# Patient Record
Sex: Male | Born: 1965 | Race: White | Hispanic: No | Smoking: Current every day smoker
Health system: Southern US, Community
[De-identification: ages and names within clinical notes are randomized; demographics above are authoritative.]

## PROBLEM LIST (undated history)

## (undated) ENCOUNTER — Emergency Department (HOSPITAL_COMMUNITY): Payer: Medicare Other

## (undated) DIAGNOSIS — B1920 Unspecified viral hepatitis C without hepatic coma: Secondary | ICD-10-CM

## (undated) DIAGNOSIS — F1920 Other psychoactive substance dependence, uncomplicated: Secondary | ICD-10-CM

## (undated) HISTORY — PX: BACK SURGERY: SHX140

## (undated) HISTORY — PX: APPENDECTOMY: SHX54

---

## 2018-12-07 ENCOUNTER — Other Ambulatory Visit: Payer: Self-pay

## 2018-12-07 ENCOUNTER — Emergency Department (HOSPITAL_COMMUNITY)
Admission: EM | Admit: 2018-12-07 | Discharge: 2018-12-07 | Disposition: A | Discharge status: Home / Self Care | Payer: Self-pay | Attending: Emergency Medicine | Admitting: Emergency Medicine

## 2018-12-07 ENCOUNTER — Emergency Department (HOSPITAL_COMMUNITY): Payer: Self-pay

## 2018-12-07 DIAGNOSIS — R109 Unspecified abdominal pain: Secondary | ICD-10-CM | POA: Insufficient documentation

## 2018-12-07 DIAGNOSIS — R0602 Shortness of breath: Secondary | ICD-10-CM | POA: Insufficient documentation

## 2018-12-07 DIAGNOSIS — R61 Generalized hyperhidrosis: Secondary | ICD-10-CM | POA: Insufficient documentation

## 2018-12-07 DIAGNOSIS — R079 Chest pain, unspecified: Secondary | ICD-10-CM | POA: Insufficient documentation

## 2018-12-07 DIAGNOSIS — R5383 Other fatigue: Secondary | ICD-10-CM | POA: Insufficient documentation

## 2018-12-07 LAB — BASIC METABOLIC PANEL
Anion gap: 9 (ref 5–15)
BUN: 16 mg/dL (ref 6–20)
CO2: 27 mmol/L (ref 22–32)
Calcium: 9.7 mg/dL (ref 8.9–10.3)
Chloride: 104 mmol/L (ref 98–111)
Creatinine, Ser: 0.84 mg/dL (ref 0.61–1.24)
GFR calc Af Amer: >60 mL/min (ref >60)
GFR calc non Af Amer: >60 mL/min (ref >60)
Glucose, Bld: 114 mg/dL — ABNORMAL HIGH (ref 70–99)
Potassium: 4.2 mmol/L (ref 3.5–5.1)
Sodium: 140 mmol/L (ref 135–145)

## 2018-12-07 LAB — CBC WITH DIFFERENTIAL/PLATELET
Abs Immature Granulocytes: 0.02 10*3/uL (ref 0.00–0.07)
Basophils Absolute: 0 10*3/uL (ref 0.0–0.1)
Basophils Relative: 1 %
Eosinophils Absolute: 0.2 10*3/uL (ref 0.0–0.5)
Eosinophils Relative: 3 %
HCT: 50.2 % (ref 39.0–52.0)
Hemoglobin: 16.6 g/dL (ref 13.0–17.0)
Immature Granulocytes: 0 %
Lymphocytes Relative: 30 %
Lymphs Abs: 1.6 10*3/uL (ref 0.7–4.0)
MCH: 30.3 pg (ref 26.0–34.0)
MCHC: 33.1 g/dL (ref 30.0–36.0)
MCV: 91.8 fL (ref 80.0–100.0)
Monocytes Absolute: 0.4 10*3/uL (ref 0.1–1.0)
Monocytes Relative: 7 %
Neutro Abs: 3.3 10*3/uL (ref 1.7–7.7)
Neutrophils Relative %: 59 %
Platelets: 85 10*3/uL — ABNORMAL LOW (ref 150–400)
RBC: 5.47 MIL/uL (ref 4.22–5.81)
RDW: 14.2 % (ref 11.5–15.5)
WBC: 5.5 10*3/uL (ref 4.0–10.5)
nRBC: 0 % (ref 0.0–0.2)

## 2018-12-07 LAB — TROPONIN I: Troponin I: <0.03 ng/mL (ref <0.03)

## 2018-12-07 MED ORDER — OMEPRAZOLE 20 MG PO CPDR: 20.0000 mg | DELAYED_RELEASE_CAPSULE | Freq: Two times a day (BID) | ORAL | 0 refills | Status: DC

## 2018-12-07 NOTE — ED Triage Notes (Signed)
Pt to ED from home. Pt wife brought to ED d/t pt having SOB over the past 7 days. Pt also c/o intermittent chest pain, back pain, and sweating  (temperature changes). Pt reports SOB worse with exertion. Pt reports family hx of cardiac problems. Pt denies medical hx.

## 2018-12-07 NOTE — ED Provider Notes (Signed)
[facility] DEPT Provider Note   CSN: [id number] Arrival date & time: 12/07/18  1028    History   Chief Complaint No chief complaint on file.   HPI Alex Jacobs is a 53 y.o. male.     HPI   89yM with exertional dyspnea. Onset about two weeks ago. Says he will feel sob and very fatigued even when walking to mailbox. Improves with rest. Also vague chest discomfort and into his back with this. He has had intermittent episodes of diaphoresis but they seem to be independent of activity. Also burning sensation in his stomach. No fever. No cough. No unusual leg pain or swelling. No personal hx of CAD but says multiple family members began having problems in their 16s. No local medical care as he just relocated from [facility] a few weeks ago.    No past medical history on file.  There are no active problems to display for this patient.      Home Medications    Prior to Admission medications   Not on File    Family History No family history on file.  Social History Social History   Tobacco Use   Smoking status: Not on file  Substance Use Topics   Alcohol use: Not on file   Drug use: Not on file     Allergies   Patient has no allergy information on record.   Review of Systems Review of Systems  All systems reviewed and negative, other than as noted in HPI.  Physical Exam Updated Vital Signs BP (!) 137/91 (BP Location: Left Arm)    Pulse 68    Temp 98.4 F (36.9 C) (Oral)    Resp 14    Ht 5\' 9"  (1.753 m)    Wt 77.1 kg    SpO2 99%    BMI 25.10 kg/m   Physical Exam Vitals signs and nursing note reviewed.  Constitutional:      General: He is not in acute distress.    Appearance: He is well-developed.  HENT:     Head: Normocephalic and atraumatic.  Eyes:     General:        Right eye: No discharge.        Left eye: No discharge.     Conjunctiva/sclera: Conjunctivae normal.  Neck:     Musculoskeletal: Neck supple.    Cardiovascular:     Rate and Rhythm: Normal rate and regular rhythm.     Heart sounds: Normal heart sounds. No murmur. No friction rub. No gallop.   Pulmonary:     Effort: Pulmonary effort is normal. No respiratory distress.     Breath sounds: Normal breath sounds.  Abdominal:     General: There is no distension.     Palpations: Abdomen is soft.     Tenderness: There is no abdominal tenderness.  Musculoskeletal:        General: No tenderness.     Comments: Lower extremities symmetric as compared to each other. No calf tenderness. Negative Homan's. No palpable cords.   Skin:    General: Skin is warm and dry.  Neurological:     Mental Status: He is alert.  Psychiatric:        Behavior: Behavior normal.        Thought Content: Thought content normal.      ED Treatments / Results  Labs (all labs ordered are listed, but only abnormal results are displayed) Labs Reviewed  CBC WITH DIFFERENTIAL/PLATELET - Abnormal;  Notable for the following components:      Result Value   Platelets 85 (*)    All other components within normal limits  BASIC METABOLIC PANEL - Abnormal; Notable for the following components:   Glucose, Bld 114 (*)    All other components within normal limits  TROPONIN I    EKG EKG Interpretation  Date/Time:  Tuesday December 07 2018 10:41:15 EDT Ventricular Rate:  62 PR Interval:    QRS Duration: 100 QT Interval:  456 QTC Calculation: 464 R Axis:   83 Text Interpretation:  Sinus rhythm Low voltage, precordial leads Baseline wander in lead(s) II III aVR aVF V4 V5 V6 No old tracing to compare Confirmed by Virgel Manifold [phone]) on 12/07/2018 10:44:04 AM   Radiology Dg Chest 2 View  Result Date: 12/07/2018 CLINICAL DATA:  Fever and shortness of breath EXAM: CHEST - 2 VIEW COMPARISON:  None. FINDINGS: Lungs are clear. Heart size and pulmonary vascularity are normal. No adenopathy. Postoperative change noted in the lower cervical spine. IMPRESSION: No edema or  consolidation. Electronically Signed   By: Lowella Grip III M.D.   On: 12/07/2018 11:24    Procedures Procedures (including critical care time)  Medications Ordered in ED Medications - No data to display   Initial Impression / Assessment and Plan / ED Course  I have reviewed the triage vital signs and the nursing notes.  Pertinent labs & imaging results that were available during my care of the patient were reviewed by me and considered in my medical decision making (see chart for details).     52yM with dyspnea. Some concern for anginal equivalent. ED w/u fairly unremarkable aside from thrombocytopenia which is likely contributory. I think ultimately he needs stress testing. He would like to pursue this as an outpt although I explained to him not having a PCP currently would be a significant barrier to this. Also may be some anxiety component in light of COVID pandemic. Clinically I doubt. Tried to reassure.   Alex Jacobs was evaluated in Emergency Department on 12/07/2018 for the symptoms described in the history of present illness. He was evaluated in the context of the global COVID-19 pandemic, which necessitated consideration that the patient might be at risk for infection with the SARS-CoV-2 virus that causes COVID-19. Institutional protocols and algorithms that pertain to the evaluation of patients at risk for COVID-19 are in a state of rapid change based on information released by regulatory bodies including the CDC and federal and state organizations. These policies and algorithms were followed during the patient's care in the ED.   Final Clinical Impressions(s) / ED Diagnoses   Final diagnoses:  Shortness of breath    ED Discharge Orders    None       Virgel Manifold, MD 12/08/18 [phone]

## 2018-12-07 NOTE — Discharge Instructions (Addendum)
I want you to follow-up and obtain a stress test. Return to the ER for worsening symptoms.

## 2018-12-07 NOTE — ED Notes (Signed)
EDP at bedside  

## 2019-04-07 ENCOUNTER — Emergency Department (HOSPITAL_COMMUNITY): Payer: Self-pay

## 2019-04-07 ENCOUNTER — Emergency Department (HOSPITAL_COMMUNITY)
Admission: EM | Admit: 2019-04-07 | Discharge: 2019-04-08 | Disposition: A | Discharge status: Home / Self Care | Payer: Self-pay | Attending: Emergency Medicine | Admitting: Emergency Medicine

## 2019-04-07 ENCOUNTER — Other Ambulatory Visit: Payer: Self-pay

## 2019-04-07 ENCOUNTER — Encounter (HOSPITAL_COMMUNITY): Payer: Self-pay

## 2019-04-07 DIAGNOSIS — R945 Abnormal results of liver function studies: Secondary | ICD-10-CM | POA: Insufficient documentation

## 2019-04-07 DIAGNOSIS — R7989 Other specified abnormal findings of blood chemistry: Secondary | ICD-10-CM

## 2019-04-07 DIAGNOSIS — F17210 Nicotine dependence, cigarettes, uncomplicated: Secondary | ICD-10-CM | POA: Insufficient documentation

## 2019-04-07 DIAGNOSIS — R112 Nausea with vomiting, unspecified: Secondary | ICD-10-CM | POA: Insufficient documentation

## 2019-04-07 DIAGNOSIS — R1084 Generalized abdominal pain: Secondary | ICD-10-CM | POA: Insufficient documentation

## 2019-04-07 DIAGNOSIS — R109 Unspecified abdominal pain: Secondary | ICD-10-CM

## 2019-04-07 DIAGNOSIS — R0789 Other chest pain: Secondary | ICD-10-CM | POA: Insufficient documentation

## 2019-04-07 DIAGNOSIS — Z79899 Other long term (current) drug therapy: Secondary | ICD-10-CM | POA: Insufficient documentation

## 2019-04-07 DIAGNOSIS — R630 Anorexia: Secondary | ICD-10-CM | POA: Insufficient documentation

## 2019-04-07 HISTORY — DX: Other psychoactive substance dependence, uncomplicated: F19.20

## 2019-04-07 LAB — CBC WITH DIFFERENTIAL/PLATELET
Abs Immature Granulocytes: 0.05 10*3/uL (ref 0.00–0.07)
Basophils Absolute: 0.1 10*3/uL (ref 0.0–0.1)
Basophils Relative: 1 %
Eosinophils Absolute: 0.2 10*3/uL (ref 0.0–0.5)
Eosinophils Relative: 2 %
HCT: 46.8 % (ref 39.0–52.0)
Hemoglobin: 16.1 g/dL (ref 13.0–17.0)
Immature Granulocytes: 1 %
Lymphocytes Relative: 22 %
Lymphs Abs: 2.2 10*3/uL (ref 0.7–4.0)
MCH: 32.9 pg (ref 26.0–34.0)
MCHC: 34.4 g/dL (ref 30.0–36.0)
MCV: 95.5 fL (ref 80.0–100.0)
Monocytes Absolute: 0.9 10*3/uL (ref 0.1–1.0)
Monocytes Relative: 9 %
Neutro Abs: 6.7 10*3/uL (ref 1.7–7.7)
Neutrophils Relative %: 65 %
Platelets: 112 10*3/uL — ABNORMAL LOW (ref 150–400)
RBC: 4.9 MIL/uL (ref 4.22–5.81)
RDW: 13.5 % (ref 11.5–15.5)
WBC: 10 10*3/uL (ref 4.0–10.5)
nRBC: 0 % (ref 0.0–0.2)

## 2019-04-07 LAB — COMPREHENSIVE METABOLIC PANEL
ALT: 65 U/L — ABNORMAL HIGH (ref 0–44)
AST: 84 U/L — ABNORMAL HIGH (ref 15–41)
Albumin: 4.6 g/dL (ref 3.5–5.0)
Alkaline Phosphatase: 74 U/L (ref 38–126)
Anion gap: 14 (ref 5–15)
BUN: 24 mg/dL — ABNORMAL HIGH (ref 6–20)
CO2: 21 mmol/L — ABNORMAL LOW (ref 22–32)
Calcium: 9.7 mg/dL (ref 8.9–10.3)
Chloride: 101 mmol/L (ref 98–111)
Creatinine, Ser: 1.47 mg/dL — ABNORMAL HIGH (ref 0.61–1.24)
GFR calc Af Amer: >60 mL/min (ref >60)
GFR calc non Af Amer: 54 mL/min — ABNORMAL LOW (ref >60)
Glucose, Bld: 80 mg/dL (ref 70–99)
Potassium: 4.3 mmol/L (ref 3.5–5.1)
Sodium: 136 mmol/L (ref 135–145)
Total Bilirubin: 2.1 mg/dL — ABNORMAL HIGH (ref 0.3–1.2)
Total Protein: 7.8 g/dL (ref 6.5–8.1)

## 2019-04-07 LAB — LIPASE, BLOOD: Lipase: 26 U/L (ref 11–51)

## 2019-04-07 MED ORDER — PANTOPRAZOLE SODIUM 40 MG IV SOLR
40.0000 mg | Freq: Once | INTRAVENOUS | Status: AC
  Administered 2019-04-07: 22:00:00 40 mg via INTRAVENOUS
  Filled 2019-04-07: qty 40

## 2019-04-07 MED ORDER — SODIUM CHLORIDE 0.9 % IV BOLUS
1000.0000 mL | Freq: Once | INTRAVENOUS | Status: AC
  Administered 2019-04-07: 23:00:00 1000 mL via INTRAVENOUS

## 2019-04-07 MED ORDER — ALUM & MAG HYDROXIDE-SIMETH 200-200-20 MG/5ML PO SUSP
30.0000 mL | Freq: Once | ORAL | Status: AC
  Administered 2019-04-07: 21:00:00 30 mL via ORAL
  Filled 2019-04-07: qty 30

## 2019-04-07 MED ORDER — LIDOCAINE VISCOUS HCL 2 % MT SOLN
15.0000 mL | Freq: Once | OROMUCOSAL | Status: AC
  Administered 2019-04-07: 21:00:00 15 mL via ORAL
  Filled 2019-04-07: qty 15

## 2019-04-07 NOTE — ED Notes (Signed)
EMS, this RN and another RN attempted an IV without success.

## 2019-04-07 NOTE — ED Notes (Signed)
Pt reminded of the need for urine again. 

## 2019-04-07 NOTE — ED Notes (Signed)
IV team at bedside 

## 2019-04-07 NOTE — ED Notes (Signed)
Patient transported to X-ray 

## 2019-04-07 NOTE — ED Triage Notes (Signed)
[place] with 7/10 CP, given 324 ASA and 1 NTG with CP down to 3/10  Hypertensive at first but after NTG 108/70 , 94% RA Cg 88. Pt also complaining of 8/10 abdominal pain and N. Pt takes methodone.

## 2019-04-07 NOTE — ED Notes (Signed)
Patient transported to Ultrasound 

## 2019-04-08 MED ORDER — SUCRALFATE 1 GM/10ML PO SUSP: 1.0000 g | Freq: Three times a day (TID) | ORAL | 0 refills | Status: DC

## 2019-04-08 MED ORDER — OMEPRAZOLE 20 MG PO CPDR: 20.0000 mg | DELAYED_RELEASE_CAPSULE | Freq: Every day | ORAL | 0 refills | Status: DC

## 2019-04-08 NOTE — ED Provider Notes (Signed)
[facility] EMERGENCY DEPARTMENT Provider Note   CSN: [id number] Arrival date & time: 04/07/19  1956     History   Chief Complaint Chief Complaint  Patient presents with   Chest Pain   Abdominal Pain    HPI Alex Jacobs is a 53 y.o. male presenting for evaluation of abdominal pain.  Patient states the past 6 to 7 days, he has been having worsening abdominal pain.  It is mostly centralized and towards the right.  He describes it as a pressure.  When he has abdominal pain, he then feels pain rising up into his chest.  Chest pain is never not accompanied by abdominal pain first.  He has associated nausea and vomiting.  Patient states his emesis is sometimes streaked with bright red.  He denies fevers, chills, sore throat, cough, shortness of breath, abnormal bowel movements.  Patient states his urine is very dark, and it appears orange and since of the bottom, this he thinks there is blood in his urine.  He denies dysuria or frequency.  Patient states he has a history of stomach problems, he does not know what they are.  He was seen by GI in [place] 6 or 7 years ago, but no diagnosis was ever made.  He states his appetite has been poor recently, but no recent weight loss.  He smokes cigarettes daily, denies alcohol or drug use.  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     HPI  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([place])     There are no active problems to display for this patient.    Home Medications    Prior to Admission medications   Medication Sig Start Date End Date Taking? Authorizing Provider  gabapentin (NEURONTIN) 400 MG capsule Take 400 mg by mouth 3 (three) times daily.   Yes [provider]  omeprazole (PRILOSEC) 20 MG capsule Take 1 capsule (20 mg total) by mouth daily. 04/08/19   Kem Parcher, PA-C  sucralfate (CARAFATE) 1 GM/10ML suspension Take 10 mLs (1 g total) by mouth 4 (four) times daily  -  with meals and at bedtime. 04/08/19   Kion Huntsberry, PA-C    Family History No family history on file.  Social History Social History   Tobacco Use   Smoking status: Not on file  Substance Use Topics   Alcohol use: Not on file   Drug use: Not on file     Allergies   Toradol [ketorolac tromethamine]   Review of Systems Review of Systems  Constitutional: Positive for appetite change.  Cardiovascular: Positive for chest pain.  Gastrointestinal: Positive for abdominal pain, nausea and vomiting.  All other systems reviewed and are negative.    Physical Exam Updated Vital Signs BP (!) 148/99    Pulse 90    Temp 98 F (36.7 C) (Oral)    Resp 14    Ht 5\' 9"  (1.753 m)    Wt 77.1 kg    SpO2 97%    BMI 25.10 kg/m   Physical Exam Vitals signs and nursing note reviewed.  Constitutional:      General: He is not in acute distress.    Appearance: He is well-developed.     Comments: Appears nontoxic  HENT:     Head: Normocephalic and atraumatic.  Eyes:     Conjunctiva/sclera: Conjunctivae normal.     Pupils: Pupils are equal, round, and reactive to light.  Neck:  Musculoskeletal: Normal range of motion and neck supple.  Cardiovascular:     Rate and Rhythm: Normal rate and regular rhythm.     Pulses: Normal pulses.  Pulmonary:     Effort: Pulmonary effort is normal. No respiratory distress.     Breath sounds: Normal breath sounds. No wheezing.     Comments: Clear lung sounds in all fields.  Speaking in full sentences Abdominal:     General: There is no distension.     Palpations: Abdomen is soft. There is no mass.     Tenderness: There is abdominal tenderness. There is no guarding or rebound.     Comments: Generalized tenderness palpation the abdomen, worse on the right side.  No rigidity, guarding, distention.  Negative rebound.  No signs of peritonitis.  Negative Murphy's.  No CVA tenderness.  Musculoskeletal: Normal range of motion.  Skin:    General: Skin  is warm and dry.     Capillary Refill: Capillary refill takes less than 2 seconds.  Neurological:     Mental Status: He is alert and oriented to person, place, and time.      ED Treatments / Results  Labs (all labs ordered are listed, but only abnormal results are displayed) Labs Reviewed  CBC WITH DIFFERENTIAL/PLATELET - Abnormal; Notable for the following components:      Result Value   Platelets 112 (*)    All other components within normal limits  COMPREHENSIVE METABOLIC PANEL - Abnormal; Notable for the following components:   CO2 21 (*)    BUN 24 (*)    Creatinine, Ser 1.47 (*)    AST 84 (*)    ALT 65 (*)    Total Bilirubin 2.1 (*)    GFR calc non Af Amer 54 (*)    All other components within normal limits  LIPASE, BLOOD  URINALYSIS, ROUTINE W REFLEX MICROSCOPIC  RAPID URINE DRUG SCREEN, HOSP PERFORMED    EKG EKG Interpretation  Date/Time:  Thursday April 07 2019 19:59:25 EDT Ventricular Rate:  89 PR Interval:    QRS Duration: 96 QT Interval:  385 QTC Calculation: 469 R Axis:   77 Text Interpretation:  Sinus rhythm No significant change since last tracing Confirmed by Theotis Burrow [phone]) on 04/07/2019 8:06:12 PM   Radiology Dg Abdomen Acute W/chest  Result Date: 04/07/2019 CLINICAL DATA:  Epigastric pain hematemesis EXAM: DG ABDOMEN ACUTE W/ 1V CHEST COMPARISON:  12/07/2018 FINDINGS: Single-view chest demonstrates hardware in the cervical spine. No focal consolidation or effusion. Normal cardiomediastinal silhouette. No pneumothorax. Supine and upright views of the abdomen demonstrate no free air beneath the diaphragm. Nonobstructed bowel-gas pattern with moderate to large stool in the colon. No radiopaque calculi. IMPRESSION: Negative abdominal radiographs.  No acute cardiopulmonary disease. Electronically Signed   By: Donavan Foil M.D.   On: 04/07/2019 20:57   US Abdomen Limited Ruq  Result Date: 04/07/2019 CLINICAL DATA:  Abdominal pain EXAM: ULTRASOUND  ABDOMEN LIMITED RIGHT UPPER QUADRANT COMPARISON:  None. FINDINGS: Gallbladder: No gallstones or wall thickening visualized. No sonographic Murphy sign noted by sonographer. Common bile duct: Diameter: 6 mm Liver: Coarse hepatic echotexture. No focal lesion. Portal vein is patent on color Doppler imaging with normal direction of blood flow towards the liver. Other: None. IMPRESSION: No sonographic evidence of acute cholecystitis. Electronically Signed   By: Ulyses Jarred M.D.   On: 04/07/2019 22:30    Procedures Procedures (including critical care time)  Medications Ordered in ED Medications  pantoprazole (PROTONIX) injection 40  mg (40 mg Intravenous Given 04/07/19 2150)  alum & mag hydroxide-simeth (MAALOX/MYLANTA) 200-200-20 MG/5ML suspension 30 mL (30 mLs Oral Given 04/07/19 2106)    And  lidocaine (XYLOCAINE) 2 % viscous mouth solution 15 mL (15 mLs Oral Given 04/07/19 2106)  sodium chloride 0.9 % bolus 1,000 mL (0 mLs Intravenous Stopped 04/08/19 0031)     Initial Impression / Assessment and Plan / ED Course  I have reviewed the triage vital signs and the nursing notes.  Pertinent labs & imaging results that were available during my care of the patient were reviewed by me and considered in my medical decision making (see chart for details).        Patient presenting for evaluation of abdominal pain.  Physical exam reassuring, he appears nontoxic.  He is afebrile not tachycardic.  He does have generalized abdominal pain.  He is reporting nausea and emesis with streaks of bright red.  As such, consider PUD versus perf vs GERD versus pancreatitis versus gallbladder etiology.  Obtain labs, urine, start Protonix, gi cocktail, and fluids.  acute abdomen to rule out perf.  Acute abdomen viewed interpreted by me, no obvious free air.  Labs show elevated creatinine at 1.4, likely due to dehydration.  Slight elevation in LFTs and bili.  As such, will obtain ultrasound to rule out gallbladder  etiology.  Ultrasound negative for cholelithiasis or cholecystitis.  Patient unable to provide a urine sample so far.  On reassessment, patient reports he is feeling much better.  Pain is improved.  Discussed findings.  Discussed option of waiting for urine for further evaluation, or symptomatic treatment at home with close follow-up.  Patient would like to leave and follow-up closely.  I discussed patient's elevated LFTs and bili.  I encouraged him to decrease smoking.  While patient denies alcohol or drug use, I encouraged him not to start.  Encouraged hydration.  Will have patient start antacid daily, Carafate as needed.  Patient given information for [facility] and wellness and GI for further evaluation.  Patient asking about PTSD medications, referred patient to outpatient clinics.  At this time, patient appears safe for discharge.  Return precautions given.  Patient states he understands and agrees to plan.  Final Clinical Impressions(s) / ED Diagnoses   Final diagnoses:  Acute abdominal pain  Abdominal pain, unspecified abdominal location  Decreased appetite  Elevated LFTs    ED Discharge Orders         Ordered    omeprazole (PRILOSEC) 20 MG capsule  Daily     04/08/19 0005    sucralfate (CARAFATE) 1 GM/10ML suspension  3 times daily with meals & bedtime     04/08/19 0005           Kimoni Pagliarulo, PA-C 04/08/19 0050    Little, Wenda Overland, MD 04/08/19 2046

## 2019-04-08 NOTE — Discharge Instructions (Addendum)
Take prilosec daily. Use Carafate as needed for breakthrough abdominal pain. Avoid anti-inflammatory such as ibuprofen, aspirin, Aleve, naproxen.  You may use Tylenol as needed for pain. Avoid spicy, greasy, acidic foods. Try to decrease smoking. Follow-up with your primary care doctor listed below.  You may also follow-up with the GI doctor as needed for further evaluation. There is information paperwork about counseling services in the area.  I recommend you follow-up with them regarding the PTSD. Return to the emergency room if you develop high fevers, persistent vomiting, severe worsening pain, any new, worsening, concerning symptoms.

## 2019-05-27 ENCOUNTER — Other Ambulatory Visit: Payer: Self-pay

## 2019-05-27 ENCOUNTER — Inpatient Hospital Stay (HOSPITAL_COMMUNITY)
Admission: EM | Admit: 2019-05-27 | Discharge: 2019-05-30 | DRG: 894 | Discharge status: Against Medical Advice | Payer: Medicare Other | Attending: Internal Medicine | Admitting: Internal Medicine

## 2019-05-27 ENCOUNTER — Emergency Department (HOSPITAL_COMMUNITY): Payer: Medicare Other

## 2019-05-27 ENCOUNTER — Encounter (HOSPITAL_COMMUNITY): Payer: Self-pay

## 2019-05-27 DIAGNOSIS — F1910 Other psychoactive substance abuse, uncomplicated: Secondary | ICD-10-CM

## 2019-05-27 DIAGNOSIS — F17210 Nicotine dependence, cigarettes, uncomplicated: Secondary | ICD-10-CM | POA: Diagnosis present

## 2019-05-27 DIAGNOSIS — G629 Polyneuropathy, unspecified: Secondary | ICD-10-CM | POA: Diagnosis present

## 2019-05-27 DIAGNOSIS — Z560 Unemployment, unspecified: Secondary | ICD-10-CM

## 2019-05-27 DIAGNOSIS — G4089 Other seizures: Secondary | ICD-10-CM | POA: Diagnosis present

## 2019-05-27 DIAGNOSIS — Z5329 Procedure and treatment not carried out because of patient's decision for other reasons: Secondary | ICD-10-CM | POA: Diagnosis not present

## 2019-05-27 DIAGNOSIS — F10239 Alcohol dependence with withdrawal, unspecified: Principal | ICD-10-CM | POA: Diagnosis present

## 2019-05-27 DIAGNOSIS — F331 Major depressive disorder, recurrent, moderate: Secondary | ICD-10-CM

## 2019-05-27 DIAGNOSIS — G4700 Insomnia, unspecified: Secondary | ICD-10-CM

## 2019-05-27 DIAGNOSIS — R569 Unspecified convulsions: Secondary | ICD-10-CM

## 2019-05-27 DIAGNOSIS — F419 Anxiety disorder, unspecified: Secondary | ICD-10-CM | POA: Diagnosis present

## 2019-05-27 DIAGNOSIS — Z888 Allergy status to other drugs, medicaments and biological substances status: Secondary | ICD-10-CM

## 2019-05-27 DIAGNOSIS — Z915 Personal history of self-harm: Secondary | ICD-10-CM

## 2019-05-27 DIAGNOSIS — F10230 Alcohol dependence with withdrawal, uncomplicated: Secondary | ICD-10-CM

## 2019-05-27 DIAGNOSIS — Z20828 Contact with and (suspected) exposure to other viral communicable diseases: Secondary | ICD-10-CM | POA: Diagnosis present

## 2019-05-27 DIAGNOSIS — F4310 Post-traumatic stress disorder, unspecified: Secondary | ICD-10-CM

## 2019-05-27 DIAGNOSIS — R45851 Suicidal ideations: Secondary | ICD-10-CM | POA: Diagnosis not present

## 2019-05-27 DIAGNOSIS — F10939 Alcohol use, unspecified with withdrawal, unspecified: Secondary | ICD-10-CM | POA: Diagnosis present

## 2019-05-27 DIAGNOSIS — F1510 Other stimulant abuse, uncomplicated: Secondary | ICD-10-CM | POA: Diagnosis present

## 2019-05-27 DIAGNOSIS — D6959 Other secondary thrombocytopenia: Secondary | ICD-10-CM | POA: Diagnosis present

## 2019-05-27 DIAGNOSIS — Z79899 Other long term (current) drug therapy: Secondary | ICD-10-CM

## 2019-05-27 DIAGNOSIS — Z791 Long term (current) use of non-steroidal anti-inflammatories (NSAID): Secondary | ICD-10-CM

## 2019-05-27 LAB — COMPREHENSIVE METABOLIC PANEL
ALT: 44 U/L (ref 0–44)
AST: 43 U/L — ABNORMAL HIGH (ref 15–41)
Albumin: 4.4 g/dL (ref 3.5–5.0)
Alkaline Phosphatase: 59 U/L (ref 38–126)
Anion gap: 7 (ref 5–15)
BUN: 13 mg/dL (ref 6–20)
CO2: 28 mmol/L (ref 22–32)
Calcium: 10 mg/dL (ref 8.9–10.3)
Chloride: 106 mmol/L (ref 98–111)
Creatinine, Ser: 0.85 mg/dL (ref 0.61–1.24)
GFR calc Af Amer: >60 mL/min (ref >60)
GFR calc non Af Amer: >60 mL/min (ref >60)
Glucose, Bld: 120 mg/dL — ABNORMAL HIGH (ref 70–99)
Potassium: 4.6 mmol/L (ref 3.5–5.1)
Sodium: 141 mmol/L (ref 135–145)
Total Bilirubin: 1.1 mg/dL (ref 0.3–1.2)
Total Protein: 7.8 g/dL (ref 6.5–8.1)

## 2019-05-27 LAB — CBC
HCT: 48.4 % (ref 39.0–52.0)
Hemoglobin: 16 g/dL (ref 13.0–17.0)
MCH: 30.8 pg (ref 26.0–34.0)
MCHC: 33.1 g/dL (ref 30.0–36.0)
MCV: 93.3 fL (ref 80.0–100.0)
Platelets: 134 10*3/uL — ABNORMAL LOW (ref 150–400)
RBC: 5.19 MIL/uL (ref 4.22–5.81)
RDW: 12.2 % (ref 11.5–15.5)
WBC: 6.2 10*3/uL (ref 4.0–10.5)
nRBC: 0 % (ref 0.0–0.2)

## 2019-05-27 LAB — ACETAMINOPHEN LEVEL: Acetaminophen (Tylenol), Serum: <10 ug/mL — ABNORMAL LOW (ref 10–30)

## 2019-05-27 LAB — TROPONIN I (HIGH SENSITIVITY)
Troponin I (High Sensitivity): <2 ng/L (ref <18)
Troponin I (High Sensitivity): <2 ng/L (ref <18)

## 2019-05-27 LAB — SALICYLATE LEVEL: Salicylate Lvl: <7 mg/dL (ref 2.8–30.0)

## 2019-05-27 LAB — ETHANOL: Alcohol, Ethyl (B): <10 mg/dL (ref <10)

## 2019-05-27 MED ORDER — LORAZEPAM 2 MG/ML IJ SOLN: 0.0000 mg | Freq: Four times a day (QID) | INTRAMUSCULAR | Status: DC

## 2019-05-27 MED ORDER — LORAZEPAM 1 MG PO TABS
0.0000 mg | ORAL_TABLET | Freq: Four times a day (QID) | ORAL | Status: DC
  Administered 2019-05-27 (×2): 1 mg via ORAL
  Filled 2019-05-27: qty 2
  Filled 2019-05-27 (×2): qty 1

## 2019-05-27 MED ORDER — LORAZEPAM 2 MG/ML IJ SOLN
1.0000 mg | Freq: Once | INTRAMUSCULAR | Status: AC
  Administered 2019-05-27: 1 mg via INTRAVENOUS
  Filled 2019-05-27: qty 1

## 2019-05-27 MED ORDER — ONDANSETRON HCL 4 MG PO TABS: 4.0000 mg | ORAL_TABLET | Freq: Three times a day (TID) | ORAL | Status: DC | PRN

## 2019-05-27 MED ORDER — LORAZEPAM 1 MG PO TABS: 0.0000 mg | ORAL_TABLET | Freq: Two times a day (BID) | ORAL | Status: DC

## 2019-05-27 MED ORDER — ACETAMINOPHEN 325 MG PO TABS
650.0000 mg | ORAL_TABLET | ORAL | Status: DC | PRN
  Administered 2019-05-27 (×2): 650 mg via ORAL
  Filled 2019-05-27 (×2): qty 2

## 2019-05-27 MED ORDER — THIAMINE HCL 100 MG/ML IJ SOLN: 100.0000 mg | Freq: Every day | INTRAMUSCULAR | Status: DC

## 2019-05-27 MED ORDER — ALUM & MAG HYDROXIDE-SIMETH 200-200-20 MG/5ML PO SUSP: 30.0000 mL | Freq: Four times a day (QID) | ORAL | Status: DC | PRN

## 2019-05-27 MED ORDER — VITAMIN B-1 100 MG PO TABS
100.0000 mg | ORAL_TABLET | Freq: Every day | ORAL | Status: DC
  Administered 2019-05-27: 100 mg via ORAL
  Filled 2019-05-27: qty 1

## 2019-05-27 MED ORDER — ZOLPIDEM TARTRATE 5 MG PO TABS: 5.0000 mg | ORAL_TABLET | Freq: Every evening | ORAL | Status: DC | PRN

## 2019-05-27 MED ORDER — LORAZEPAM 2 MG/ML IJ SOLN: 0.0000 mg | Freq: Two times a day (BID) | INTRAMUSCULAR | Status: DC

## 2019-05-27 MED ORDER — NICOTINE 21 MG/24HR TD PT24
21.0000 mg | MEDICATED_PATCH | Freq: Every day | TRANSDERMAL | Status: DC
  Administered 2019-05-27: 21 mg via TRANSDERMAL
  Filled 2019-05-27: qty 1

## 2019-05-27 NOTE — ED Triage Notes (Signed)
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  Patient states he last drank alcohol yesterday AM and had 3-4 mixed drinks. Patient states his last use of heroin was yesterday and was mixed with other things.  Patient states he has suicidal thoughts, but no plan. Patient denies visual or auditory hallucinatins or HI.

## 2019-05-27 NOTE — ED Provider Notes (Signed)
  Physical Exam  BP 128/87 (BP Location: Left Arm)   Pulse 88   Temp 98.4 F (36.9 C) (Oral)   Resp 17   Ht 5\' 9"  (1.753 m)   Wt 70.3 kg   SpO2 99%   BMI 22.89 kg/m   Physical Exam  ED Course/Procedures     Procedures  MDM  Patient here with SI, alcohol and amphetamine abuse.  Behavioral health recommend admission.  His initial CIWA was 7.  I was called to the room around 6:30 PM because patient apparently had a seizure.  His alcohol level was less than 10.  Patient was tremulous and was altered.  CT head was unremarkable.  9:35 PM Patient's CT head is negative. CIWA is now 54. Started IV ativan. Will admit for alcohol withdrawal seizure. When he is medically stabilized, then can be admitted to behavioral health.   CRITICAL CARE Performed by: Wandra Arthurs   Total critical care time: 30 minutes  Critical care time was exclusive of separately billable procedures and treating other patients.  Critical care was necessary to treat or prevent imminent or life-threatening deterioration.  Critical care was time spent personally by me on the following activities: development of treatment plan with patient and/or surrogate as well as nursing, discussions with consultants, evaluation of patient's response to treatment, examination of patient, obtaining history from patient or surrogate, ordering and performing treatments and interventions, ordering and review of laboratory studies, ordering and review of radiographic studies, pulse oximetry and re-evaluation of patient's condition.     Drenda Freeze, MD 05/27/19 2136

## 2019-05-27 NOTE — ED Notes (Signed)
Pt was witnessed having seizure, shacking. Pt was ambulating, was witnessed going to knee and then over to floor. Pt did not hit floor or head. Pt was turned safely to side. MD notified and assessed pt.pt was able to stand and get in bed.  Orders were put in. Charge nurse was notified. IV was started.  Pt safely in bed.

## 2019-05-27 NOTE — ED Provider Notes (Signed)
[facility] DEPT Provider Note   CSN: [id number] Arrival date & time: 05/27/19  1005     History   Chief Complaint Chief Complaint  Patient presents with  . detox  . Suicidal    HPI Alex Jacobs is a 53 y.o. male with a hx of polysubstance abuse who presents to the ED with complaints of wanting detox & SI. Patient states he has been utilizing opiods for several years & over the last year started using methamphetamines & alcohol. States he utilizes opioids (heroin/fentanyl), methamphetamines, & alcohol (about a fifth of vodka) daily. Last utilized yesterday @ 13:00. States he feels he is starting to withdrawal some- he feels nauseated, anxious, like his skin is crawling, 7 has some chest tightness that started today. Chest tightness has been constant since onset w/o alleviating/aggravating factors. Also endorses SI initially with plan to OD. Denies Hi/hallucinations. Denies fever, URI sxs, cough, dyspnea, vomiting, diaphoresis, syncope, seizures, or abdominal pain. Denies hx of EtOH withdrawal or DTs.     HPI  Past Medical History:  Diagnosis Date  . Addiction ([place])     There are no active problems to display for this patient.   Past Surgical History:  Procedure Laterality Date  . BACK SURGERY          Home Medications    Prior to Admission medications   Medication Sig Start Date End Date Taking? Authorizing Provider  gabapentin (NEURONTIN) 400 MG capsule Take 400 mg by mouth 3 (three) times daily.    [provider]  omeprazole (PRILOSEC) 20 MG capsule Take 1 capsule (20 mg total) by mouth daily. 04/08/19   Caccavale, Sophia, PA-C  sucralfate (CARAFATE) 1 GM/10ML suspension Take 10 mLs (1 g total) by mouth 4 (four) times daily -  with meals and at bedtime. 04/08/19   Caccavale, Sophia, PA-C    Family History Family History  Problem Relation Age of Onset  . Cancer Mother   . Cancer Father     Social History Social History   Tobacco Use  . Smoking status: Current Every Day Smoker    Packs/day: 1.00    Types: Cigarettes  . Smokeless tobacco: Never Used  Substance Use Topics  . Alcohol use: Yes    Comment: daily use  a fifth a day  . Drug use: Yes    Types: Methamphetamines    Comment: heroin, fentanyl     Allergies   Toradol [ketorolac tromethamine]   Review of Systems Review of Systems  Constitutional: Negative for fever.  HENT: Negative for ear pain and sore throat.   Respiratory: Negative for cough and shortness of breath.   Cardiovascular: Positive for chest pain.  Gastrointestinal: Positive for nausea. Negative for abdominal pain, blood in stool, constipation, diarrhea and vomiting.  Neurological: Negative for seizures, syncope, weakness and numbness.  Psychiatric/Behavioral: Positive for suicidal ideas. Negative for hallucinations. The patient is nervous/anxious.   All other systems reviewed and are negative.    Physical Exam Updated Vital Signs BP (!) 143/92 (BP Location: Left Arm)   Pulse 98   Temp 97.7 F (36.5 C) (Oral)   Resp 18   Ht 5\' 9"  (1.753 m)   Wt 70.3 kg   SpO2 100%   BMI 22.89 kg/m   Physical Exam Vitals signs and nursing note reviewed.  Constitutional:      General: He is not in acute distress.    Appearance: He is well-developed. He is not toxic-appearing.  HENT:  Head: Normocephalic and atraumatic.  Eyes:     General:        Right eye: No discharge.        Left eye: No discharge.     Conjunctiva/sclera: Conjunctivae normal.  Neck:     Musculoskeletal: Neck supple.  Cardiovascular:     Rate and Rhythm: Normal rate and regular rhythm.     Comments: 2+ symmetric radial pulses.  Pulmonary:     Effort: Pulmonary effort is normal. No respiratory distress.     Breath sounds: Normal breath sounds. No wheezing, rhonchi or rales.  Abdominal:     General: There is no distension.     Palpations: Abdomen is soft.     Tenderness: There is no abdominal  tenderness. There is no guarding or rebound.  Skin:    General: Skin is warm and dry.     Findings: No rash.  Neurological:     Mental Status: He is alert.     Comments: Clear speech.   Psychiatric:        Attention and Perception: He does not perceive auditory or visual hallucinations.        Mood and Affect: Mood is anxious (mild).        Thought Content: Thought content includes suicidal ideation. Thought content does not include homicidal ideation.      ED Treatments / Results  Labs (all labs ordered are listed, but only abnormal results are displayed) Labs Reviewed  COMPREHENSIVE METABOLIC PANEL - Abnormal; Notable for the following components:      Result Value   Glucose, Bld 120 (*)    AST 43 (*)    All other components within normal limits  ACETAMINOPHEN LEVEL - Abnormal; Notable for the following components:   Acetaminophen (Tylenol), Serum <10 (*)    All other components within normal limits  CBC - Abnormal; Notable for the following components:   Platelets 134 (*)    All other components within normal limits  ETHANOL  SALICYLATE LEVEL  RAPID URINE DRUG SCREEN, HOSP PERFORMED  TROPONIN I (HIGH SENSITIVITY)  TROPONIN I (HIGH SENSITIVITY)    EKG EKG Interpretation  Date/Time:  Friday May 27 2019 11:20:40 EDT Ventricular Rate:  78 PR Interval:  146 QRS Duration: 88 QT Interval:  386 QTC Calculation: 440 R Axis:   79 Text Interpretation:  Sinus rhythm with sinus arrhythmia with occasional Premature ventricular complexes Abnormal ECG No STEMI  Confirmed by Octaviano Glow [phone]) on 05/27/2019 11:54:09 AM   Radiology Dg Chest 2 View  Result Date: 05/27/2019 CLINICAL DATA:  53 year old male with history of chest tightness and anxiety. EXAM: CHEST - 2 VIEW COMPARISON:  Chest x-ray 12/07/2018. FINDINGS: Lung volumes are normal. No consolidative airspace disease. No pleural effusions. No pneumothorax. No pulmonary nodule or mass noted. Pulmonary vasculature  and the cardiomediastinal silhouette are within normal limits. Orthopedic fixation hardware in the lower cervical spine incidentally noted. IMPRESSION: No radiographic evidence of acute cardiopulmonary disease. Electronically Signed   By: Vinnie Langton M.D.   On: 05/27/2019 11:40    Procedures Procedures (including critical care time)  Medications Ordered in ED Medications  LORazepam (ATIVAN) injection 0-4 mg ( Intravenous See Alternative 05/27/19 1653)    Or  LORazepam (ATIVAN) tablet 0-4 mg (1 mg Oral Given 05/27/19 1653)  LORazepam (ATIVAN) injection 0-4 mg (has no administration in time range)    Or  LORazepam (ATIVAN) tablet 0-4 mg (has no administration in time range)  thiamine (VITAMIN B-1) tablet 100  mg (100 mg Oral Given 05/27/19 1229)    Or  thiamine (B-1) injection 100 mg ( Intravenous See Alternative 05/27/19 1229)  acetaminophen (TYLENOL) tablet 650 mg (650 mg Oral Given 05/27/19 1607)  zolpidem (AMBIEN) tablet 5 mg (has no administration in time range)  ondansetron (ZOFRAN) tablet 4 mg (has no administration in time range)  alum & mag hydroxide-simeth (MAALOX/MYLANTA) 200-200-20 MG/5ML suspension 30 mL (has no administration in time range)  nicotine (NICODERM CQ - dosed in mg/24 hours) patch 21 mg (21 mg Transdermal Patch Applied 05/27/19 1547)     Initial Impression / Assessment and Plan / ED Course  I have reviewed the triage vital signs and the nursing notes.  Pertinent labs & imaging results that were available during my care of the patient were reviewed by me and considered in my medical decision making (see chart for details).   Patient presents to the emergency department requesting detox with suicidal ideations.  He mentions symptoms he suspects are secondary withdrawal including chest tightness.  He is nontoxic-appearing, no apparent distress, vitals without significant abnormality, BP somewhat elevated, doubt HTN emergency.  Benign physical exam.  Labs with  thrombocytopenia similar to prior.  Chest x-ray negative for infiltrate, pneumothorax, fluid overload.  EKG no STEMI, troponin < 2 x 2- do not suspect ACS. Low risk wells- doubt PE.  Overall do not suspect life-threatening etiology to chest discomfort.  Patient placed on CIWA  Medically cleared for TTS, disposition per [facility].   Final Clinical Impressions(s) / ED Diagnoses   Final diagnoses:  Suicidal ideation  Polysubstance abuse [facility])    ED Discharge Orders    None       Amaryllis Dyke, PA-C 05/27/19 1740    Wyvonnia Dusky, MD 05/27/19 [phone]

## 2019-05-27 NOTE — Progress Notes (Signed)
05/27/2019  1110  UA sent to main lab. Attempted x 2 to draw labs unsuccessful. Called lab [phone] to come draw labs.
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
## 2019-05-28 ENCOUNTER — Encounter (HOSPITAL_COMMUNITY): Payer: Self-pay | Admitting: Internal Medicine

## 2019-05-28 DIAGNOSIS — D6959 Other secondary thrombocytopenia: Secondary | ICD-10-CM | POA: Diagnosis present

## 2019-05-28 DIAGNOSIS — F10239 Alcohol dependence with withdrawal, unspecified: Secondary | ICD-10-CM | POA: Diagnosis present

## 2019-05-28 DIAGNOSIS — R45851 Suicidal ideations: Secondary | ICD-10-CM | POA: Diagnosis present

## 2019-05-28 DIAGNOSIS — Z79899 Other long term (current) drug therapy: Secondary | ICD-10-CM | POA: Diagnosis not present

## 2019-05-28 DIAGNOSIS — Z888 Allergy status to other drugs, medicaments and biological substances status: Secondary | ICD-10-CM | POA: Diagnosis not present

## 2019-05-28 DIAGNOSIS — F419 Anxiety disorder, unspecified: Secondary | ICD-10-CM | POA: Diagnosis present

## 2019-05-28 DIAGNOSIS — F331 Major depressive disorder, recurrent, moderate: Secondary | ICD-10-CM | POA: Diagnosis present

## 2019-05-28 DIAGNOSIS — Z5329 Procedure and treatment not carried out because of patient's decision for other reasons: Secondary | ICD-10-CM | POA: Diagnosis not present

## 2019-05-28 DIAGNOSIS — Z791 Long term (current) use of non-steroidal anti-inflammatories (NSAID): Secondary | ICD-10-CM | POA: Diagnosis not present

## 2019-05-28 DIAGNOSIS — Z20828 Contact with and (suspected) exposure to other viral communicable diseases: Secondary | ICD-10-CM | POA: Diagnosis present

## 2019-05-28 DIAGNOSIS — F1510 Other stimulant abuse, uncomplicated: Secondary | ICD-10-CM | POA: Diagnosis present

## 2019-05-28 DIAGNOSIS — Z915 Personal history of self-harm: Secondary | ICD-10-CM | POA: Diagnosis not present

## 2019-05-28 DIAGNOSIS — Z560 Unemployment, unspecified: Secondary | ICD-10-CM | POA: Diagnosis not present

## 2019-05-28 DIAGNOSIS — F17210 Nicotine dependence, cigarettes, uncomplicated: Secondary | ICD-10-CM | POA: Diagnosis present

## 2019-05-28 DIAGNOSIS — G629 Polyneuropathy, unspecified: Secondary | ICD-10-CM | POA: Diagnosis present

## 2019-05-28 DIAGNOSIS — G4089 Other seizures: Secondary | ICD-10-CM | POA: Diagnosis present

## 2019-05-28 DIAGNOSIS — F4310 Post-traumatic stress disorder, unspecified: Secondary | ICD-10-CM | POA: Diagnosis present

## 2019-05-28 DIAGNOSIS — G4700 Insomnia, unspecified: Secondary | ICD-10-CM | POA: Diagnosis present

## 2019-05-28 LAB — COMPREHENSIVE METABOLIC PANEL
ALT: 35 U/L (ref 0–44)
AST: 31 U/L (ref 15–41)
Albumin: 3.8 g/dL (ref 3.5–5.0)
Alkaline Phosphatase: 52 U/L (ref 38–126)
Anion gap: 9 (ref 5–15)
BUN: 19 mg/dL (ref 6–20)
CO2: 22 mmol/L (ref 22–32)
Calcium: 9.3 mg/dL (ref 8.9–10.3)
Chloride: 108 mmol/L (ref 98–111)
Creatinine, Ser: 0.81 mg/dL (ref 0.61–1.24)
GFR calc Af Amer: >60 mL/min (ref >60)
GFR calc non Af Amer: >60 mL/min (ref >60)
Glucose, Bld: 99 mg/dL (ref 70–99)
Potassium: 3.4 mmol/L — ABNORMAL LOW (ref 3.5–5.1)
Sodium: 139 mmol/L (ref 135–145)
Total Bilirubin: 1.1 mg/dL (ref 0.3–1.2)
Total Protein: 6.8 g/dL (ref 6.5–8.1)

## 2019-05-28 LAB — CBC
HCT: 43.2 % (ref 39.0–52.0)
Hemoglobin: 14.9 g/dL (ref 13.0–17.0)
MCH: 31.3 pg (ref 26.0–34.0)
MCHC: 34.5 g/dL (ref 30.0–36.0)
MCV: 90.8 fL (ref 80.0–100.0)
Platelets: 123 10*3/uL — ABNORMAL LOW (ref 150–400)
RBC: 4.76 MIL/uL (ref 4.22–5.81)
RDW: 12 % (ref 11.5–15.5)
WBC: 7.4 10*3/uL (ref 4.0–10.5)
nRBC: 0 % (ref 0.0–0.2)

## 2019-05-28 LAB — URINALYSIS, ROUTINE W REFLEX MICROSCOPIC
Bilirubin Urine: NEGATIVE
Glucose, UA: NEGATIVE mg/dL
Hgb urine dipstick: NEGATIVE
Ketones, ur: NEGATIVE mg/dL
Leukocytes,Ua: NEGATIVE
Nitrite: NEGATIVE
Protein, ur: NEGATIVE mg/dL
Specific Gravity, Urine: 1.016 (ref 1.005–1.030)
pH: 6 (ref 5.0–8.0)

## 2019-05-28 LAB — RAPID URINE DRUG SCREEN, HOSP PERFORMED
Amphetamines: NOT DETECTED
Barbiturates: NOT DETECTED
Benzodiazepines: POSITIVE — AB
Cocaine: NOT DETECTED
Opiates: POSITIVE — AB
Tetrahydrocannabinol: POSITIVE — AB

## 2019-05-28 LAB — HIV ANTIBODY (ROUTINE TESTING W REFLEX): HIV Screen 4th Generation wRfx: NONREACTIVE

## 2019-05-28 LAB — MAGNESIUM: Magnesium: 2.1 mg/dL (ref 1.7–2.4)

## 2019-05-28 LAB — SARS CORONAVIRUS 2 (TAT 6-24 HRS): SARS Coronavirus 2: NEGATIVE

## 2019-05-28 LAB — CREATININE, SERUM
Creatinine, Ser: 0.71 mg/dL (ref 0.61–1.24)
GFR calc Af Amer: >60 mL/min (ref >60)
GFR calc non Af Amer: >60 mL/min (ref >60)

## 2019-05-28 LAB — PHOSPHORUS: Phosphorus: 3.9 mg/dL (ref 2.5–4.6)

## 2019-05-28 MED ORDER — LORAZEPAM 1 MG PO TABS
0.0000 mg | ORAL_TABLET | Freq: Two times a day (BID) | ORAL | Status: DC
  Administered 2019-05-30: 3 mg via ORAL
  Filled 2019-05-28: qty 2
  Filled 2019-05-28: qty 3

## 2019-05-28 MED ORDER — ADULT MULTIVITAMIN W/MINERALS CH
1.0000 | ORAL_TABLET | Freq: Every day | ORAL | Status: DC
  Administered 2019-05-28 – 2019-05-30 (×3): 1 via ORAL
  Filled 2019-05-28 (×3): qty 1

## 2019-05-28 MED ORDER — LORAZEPAM 1 MG PO TABS
0.0000 mg | ORAL_TABLET | Freq: Four times a day (QID) | ORAL | Status: AC
  Administered 2019-05-28 – 2019-05-29 (×6): 2 mg via ORAL
  Administered 2019-05-29: 1 mg via ORAL
  Administered 2019-05-29: 2 mg via ORAL
  Filled 2019-05-28 (×7): qty 2

## 2019-05-28 MED ORDER — VITAMIN B-1 100 MG PO TABS
100.0000 mg | ORAL_TABLET | Freq: Every day | ORAL | Status: DC
  Administered 2019-05-28 – 2019-05-30 (×3): 100 mg via ORAL
  Filled 2019-05-28 (×3): qty 1

## 2019-05-28 MED ORDER — LORAZEPAM 2 MG/ML IJ SOLN
1.0000 mg | INTRAMUSCULAR | Status: DC | PRN
  Administered 2019-05-28 (×5): 2 mg via INTRAVENOUS
  Administered 2019-05-29: 1 mg via INTRAVENOUS
  Administered 2019-05-30: 2 mg via INTRAVENOUS
  Filled 2019-05-28 (×8): qty 1

## 2019-05-28 MED ORDER — THIAMINE HCL 100 MG/ML IJ SOLN: 100.0000 mg | Freq: Every day | INTRAMUSCULAR | Status: DC

## 2019-05-28 MED ORDER — LORAZEPAM 1 MG PO TABS
1.0000 mg | ORAL_TABLET | ORAL | Status: DC | PRN
  Administered 2019-05-29 (×2): 2 mg via ORAL
  Administered 2019-05-29 (×2): 1 mg via ORAL
  Administered 2019-05-30: 2 mg via ORAL
  Administered 2019-05-30: 3 mg via ORAL
  Administered 2019-05-30: 2 mg via ORAL
  Filled 2019-05-28: qty 1
  Filled 2019-05-28: qty 2
  Filled 2019-05-28: qty 1
  Filled 2019-05-28: qty 2
  Filled 2019-05-28: qty 3
  Filled 2019-05-28: qty 2

## 2019-05-28 MED ORDER — ONDANSETRON HCL 4 MG/2ML IJ SOLN: 4.0000 mg | Freq: Four times a day (QID) | INTRAMUSCULAR | Status: DC | PRN

## 2019-05-28 MED ORDER — SODIUM CHLORIDE 0.9 % IV SOLN
INTRAVENOUS | Status: AC
  Administered 2019-05-28 (×2): via INTRAVENOUS

## 2019-05-28 MED ORDER — ACETAMINOPHEN 650 MG RE SUPP: 650.0000 mg | Freq: Four times a day (QID) | RECTAL | Status: DC | PRN

## 2019-05-28 MED ORDER — FOLIC ACID 1 MG PO TABS
1.0000 mg | ORAL_TABLET | Freq: Every day | ORAL | Status: DC
  Administered 2019-05-28 – 2019-05-30 (×3): 1 mg via ORAL
  Filled 2019-05-28 (×3): qty 1

## 2019-05-28 MED ORDER — ONDANSETRON HCL 4 MG PO TABS: 4.0000 mg | ORAL_TABLET | Freq: Four times a day (QID) | ORAL | Status: DC | PRN

## 2019-05-28 MED ORDER — ACETAMINOPHEN 325 MG PO TABS
650.0000 mg | ORAL_TABLET | Freq: Four times a day (QID) | ORAL | Status: DC | PRN
  Administered 2019-05-28 – 2019-05-30 (×6): 650 mg via ORAL
  Filled 2019-05-28 (×6): qty 2

## 2019-05-28 MED ORDER — HEPARIN SODIUM (PORCINE) 5000 UNIT/ML IJ SOLN
5000.0000 [IU] | Freq: Three times a day (TID) | INTRAMUSCULAR | Status: DC
  Administered 2019-05-28 – 2019-05-30 (×7): 5000 [IU] via SUBCUTANEOUS
  Filled 2019-05-28 (×8): qty 1

## 2019-05-28 NOTE — Progress Notes (Signed)
   Follow Up Note  HPI: Please see full H&P done earlier this morning Briefly, 53 year old male with history of polysubstance abuse including alcohol, IV heroin, methamphetamines presents to the ER with suicidal ideation and requesting detox from polysubstance abuse.  Around 6 PM on 05/27/2019, patient noted to have a witnessed generalized tonic clonic seizure lasting about less than a minute, was noted to be postictal after the episode.  CT head done was unremarkable.  Patient was initially accepted for admission at [facility], but due to the seizure episode, patient was admitted under medicine to observe.   Today, patient denies any new complaints.  Noted to be sleeping, easily arousable.    Exam: CV: S1-S2 present Lungs: CTA B Abd: Soft, nontender, nondistended, bowel sounds present Ext: No pedal edema bilaterally noted  Present on Admission: . Alcohol withdrawal (HCC)   Assessment/plan Alcohol withdrawal seizures Afebrile, no leukocytosis UDS pending CT head unremarkable Patient will be monitored for about 24-48 hours, with seizure precautions, CIWA protocol Plan to transfer to [facility] once stable

## 2019-05-28 NOTE — Progress Notes (Signed)
Pt's girlfriend visiting at this time

## 2019-05-28 NOTE — Progress Notes (Signed)
Pt ambulated to restroom. 

## 2019-05-28 NOTE — H&P (Signed)
History and Physical    Alex Jacobs [id number] DOB: Sep 24, 1965 DOA: 05/27/2019  PCP: Patient, No Pcp Per  Patient coming from: Home.  Chief Complaint: Wants to detox and suicidal.  HPI: Alex Jacobs is a 53 y.o. male with history of polysubstance abuse including alcohol and IV heroin and methamphetamine presents to the ER with suicidal ideation and wants detox from his polysubstance abuse.  Denies having attempted any suicide.  Patient's last intake of alcohol was on the day of admission and IV heroin was used a day prior.  Denies any fever chills chest pain shortness of breath nausea vomiting or diarrhea.  Has never had seizures before.  ED Course: In the ER patient was observed and labs came back with alcohol level undetectable acetaminophen and salicylates were negative.  Creatinine was 0.8 platelets 134 high-sensitivity troponin was less than 2 and EKG was normal sinus rhythm.  Behavioral health was consulted and patient was planned to be transferred over to behavioral health when patient started having an episode of generalized tonic-clonic seizure lasting less than a minute postictal after the episode.  Patient CIWA score started increasing up to the seizure.  Admitted for further observation for alcohol withdrawal and alcohol withdrawal seizure.  CT head was unremarkable.  COVID-19 test is pending.  Review of Systems: As per HPI, rest all negative.   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[facility])     Past Surgical History:  Procedure Laterality Date   BACK SURGERY       reports that he has been smoking cigarettes. He has been smoking about 1.00 pack per day. He has never used smokeless tobacco. He reports current alcohol use. He reports current drug use. Drug: Methamphetamines.  Allergies  Allergen Reactions   Toradol [Ketorolac Tromethamine]     anxiety    Family History  Problem Relation Age of Onset   Cancer Mother    Cancer Father     Prior  to Admission medications   Medication Sig Start Date End Date Taking? Authorizing Provider  ibuprofen (ADVIL) 200 MG tablet Take 400 mg by mouth every 6 (six) hours as needed for fever, headache or moderate pain.   Yes [provider]    Physical Exam: Constitutional: Moderately built and nourished. Vitals:   05/27/19 1655 05/27/19 1904 05/27/19 2128 05/27/19 2232  BP: 132/80 (!) 126/93 128/87 (!) 139/94  Pulse: 97 96 88 84  Resp: 18 20 17 19   Temp: 98.9 F (37.2 C) 99.2 F (37.3 C) 98.4 F (36.9 C) 98.3 F (36.8 C)  TempSrc: Oral Oral Oral Oral  SpO2: 98% 96% 99% 94%  Weight:      Height:       Eyes: Anicteric no pallor. ENMT: No discharge from the ears eyes nose and mouth. Neck: No mass felt.  No neck rigidity. Respiratory: No rhonchi or crepitations. Cardiovascular: S1-S2 heard. Abdomen: Soft nontender bowel sounds present. Musculoskeletal: No edema. Skin: No rash. Neurologic: Alert awake oriented to time place and person.  Moves all extremities. Psychiatric: Depressed and suicidal.   Labs on Admission: I have personally reviewed following labs and imaging studies  CBC: Recent Labs  Lab 05/27/19 1138  WBC 6.2  HGB 16.0  HCT 48.4  MCV 93.3  PLT [id number]*   Basic Metabolic Panel: Recent Labs  Lab 05/27/19 1138  NA 141  K 4.6  CL 106  CO2 28  GLUCOSE 120*  BUN 13  CREATININE 0.85  CALCIUM 10.0  GFR: Estimated Creatinine Clearance: 102.2 mL/min (by C-G formula based on SCr of 0.85 mg/dL). Liver Function Tests: Recent Labs  Lab 05/27/19 1138  AST 43*  ALT 44  ALKPHOS 59  BILITOT 1.1  PROT 7.8  ALBUMIN 4.4   No results for input(s): LIPASE, AMYLASE in the last 168 hours. No results for input(s): AMMONIA in the last 168 hours. Coagulation Profile: No results for input(s): INR, PROTIME in the last 168 hours. Cardiac Enzymes: No results for input(s): CKTOTAL, CKMB, CKMBINDEX, TROPONINI in the last 168 hours. BNP (last 3 results) No results  for input(s): PROBNP in the last 8760 hours. HbA1C: No results for input(s): HGBA1C in the last 72 hours. CBG: No results for input(s): GLUCAP in the last 168 hours. Lipid Profile: No results for input(s): CHOL, HDL, LDLCALC, TRIG, CHOLHDL, LDLDIRECT in the last 72 hours. Thyroid Function Tests: No results for input(s): TSH, T4TOTAL, FREET4, T3FREE, THYROIDAB in the last 72 hours. Anemia Panel: No results for input(s): VITAMINB12, FOLATE, FERRITIN, TIBC, IRON, RETICCTPCT in the last 72 hours. Urine analysis: No results found for: COLORURINE, APPEARANCEUR, LABSPEC, PHURINE, GLUCOSEU, HGBUR, BILIRUBINUR, KETONESUR, PROTEINUR, UROBILINOGEN, NITRITE, LEUKOCYTESUR Sepsis Labs: @LABRCNTIP (procalcitonin:4,lacticidven:4) )No results found for this or any previous visit (from the past 240 hour(s)).   Radiological Exams on Admission: Dg Chest 2 View  Result Date: 05/27/2019 CLINICAL DATA:  53 year old male with history of chest tightness and anxiety. EXAM: CHEST - 2 VIEW COMPARISON:  Chest x-ray 12/07/2018. FINDINGS: Lung volumes are normal. No consolidative airspace disease. No pleural effusions. No pneumothorax. No pulmonary nodule or mass noted. Pulmonary vasculature and the cardiomediastinal silhouette are within normal limits. Orthopedic fixation hardware in the lower cervical spine incidentally noted. IMPRESSION: No radiographic evidence of acute cardiopulmonary disease. Electronically Signed   By: Vinnie Langton M.D.   On: 05/27/2019 11:40   Ct Head Wo Contrast  Result Date: 05/27/2019 CLINICAL DATA:  Seizure, new, nontraumatic, >40 yrs Altered level of consciousness (LOC), unexplained EXAM: CT HEAD WITHOUT CONTRAST TECHNIQUE: Contiguous axial images were obtained from the base of the skull through the vertex without intravenous contrast. COMPARISON:  None. FINDINGS: Brain: No intracranial hemorrhage, mass effect, or midline shift. No hydrocephalus. The basilar cisterns are patent. No  evidence of territorial infarct or acute ischemia. No extra-axial or intracranial fluid collection. Vascular: No hyperdense vessel. Skull: No fracture or focal lesion. Sinuses/Orbits: Paranasal sinuses and mastoid air cells are clear. Tiny mucous retention cyst in left side of sphenoid sinus. The visualized orbits are unremarkable. Other: None. IMPRESSION: No acute finding or explanation for seizure. Electronically Signed   By: Keith Rake M.D.   On: 05/27/2019 19:29    EKG: Independently reviewed.  Normal sinus rhythm.  Assessment/Plan Principal Problem:   Alcohol withdrawal (HCC) Active Problems:   Suicidal ideation    1. Alcohol withdrawal with alcohol withdrawal seizure one episode -will observe in telemetry on CIWA protocol.  Patient seizures likely from alcohol withdrawal.  Presently appears nonfocal and has no headache fever or chills. 2. Suicidal thoughts with depression -patient was accepted by behavioral health.  Will need to be transferred to behavioral once patient is stable. 3. Polysubstance abuse -will need counseling.  Advised about quitting. 4. Mild thrombocytopenia likely from alcohol abuse.  Follow CBC.  Patient do not be driving for at least 6 months.  UA and urine drug screen are pending.  COVID-19 test is pending.   DVT prophylaxis: Lovenox. Code Status: Full code. Family Communication: Discussed with patient. Disposition Plan: To  be determined. Consults called: Psychiatry. Admission status: Observation.   Rise Patience MD [facility] Pager [phone].  If 7PM-7AM, please contact night-coverage [email or web address] Password [email or web address]  05/28/2019, 12:11 AM

## 2019-05-28 NOTE — Progress Notes (Signed)
Pt lunch ordered. Pt taking shower.

## 2019-05-28 NOTE — Progress Notes (Signed)
Pt given phone per request

## 2019-05-28 NOTE — Progress Notes (Signed)
Pt given breakfast tray

## 2019-05-28 NOTE — Progress Notes (Signed)
Pt given lunch tray.

## 2019-05-28 NOTE — Progress Notes (Signed)
Pt dinner tray ordered.

## 2019-05-28 NOTE — Progress Notes (Signed)
Pt breakfast ordered

## 2019-05-28 NOTE — Progress Notes (Signed)
Received Tevion at the change of shift awake in his room with a compliant of a headache. He was medicated per order. Later the doctor arrived to assess him and he received Ativan 1 mg IV for a CIWA score of 12. The repeat  CIWA score was 12 and he received Ativan 2 mg PO. He is sleeping without incident. IV N/S was started at 75/ cc per hour and blood woke completed. The sitter remains at the bedside. Report was called to nurse Jackelyn Poling and he was transferred via chair with nurse Amber and the sitter.

## 2019-05-28 NOTE — Progress Notes (Signed)
0300 - Pt arrived from the ED with belongings ( black back pack & 2 hosp belonging bags) placed in storage bins on 5E supply room. Pt was informed of suicide precautions while in 1514 and voiced understanding. Asking when next medicine is due. Pt appears calm ( no tremors or shakes, but does have a HA

## 2019-05-29 LAB — BASIC METABOLIC PANEL
Anion gap: 10 (ref 5–15)
BUN: 16 mg/dL (ref 6–20)
CO2: 19 mmol/L — ABNORMAL LOW (ref 22–32)
Calcium: 8.9 mg/dL (ref 8.9–10.3)
Chloride: 111 mmol/L (ref 98–111)
Creatinine, Ser: 0.8 mg/dL (ref 0.61–1.24)
GFR calc Af Amer: >60 mL/min (ref >60)
GFR calc non Af Amer: >60 mL/min (ref >60)
Glucose, Bld: 97 mg/dL (ref 70–99)
Potassium: 4.4 mmol/L (ref 3.5–5.1)
Sodium: 140 mmol/L (ref 135–145)

## 2019-05-29 LAB — DIFFERENTIAL
Abs Immature Granulocytes: 0.05 10*3/uL (ref 0.00–0.07)
Basophils Absolute: 0 10*3/uL (ref 0.0–0.1)
Basophils Relative: 0 %
Eosinophils Absolute: 0 10*3/uL (ref 0.0–0.5)
Eosinophils Relative: 1 %
Immature Granulocytes: 1 %
Lymphocytes Relative: 25 %
Lymphs Abs: 1.3 10*3/uL (ref 0.7–4.0)
Monocytes Absolute: 0.2 10*3/uL (ref 0.1–1.0)
Monocytes Relative: 4 %
Neutro Abs: 3.5 10*3/uL (ref 1.7–7.7)
Neutrophils Relative %: 69 %

## 2019-05-29 LAB — CBC
HCT: 42.7 % (ref 39.0–52.0)
Hemoglobin: 14.7 g/dL (ref 13.0–17.0)
MCH: 30.6 pg (ref 26.0–34.0)
MCHC: 34.4 g/dL (ref 30.0–36.0)
MCV: 89 fL (ref 80.0–100.0)
Platelets: 117 10*3/uL — ABNORMAL LOW (ref 150–400)
RBC: 4.8 MIL/uL (ref 4.22–5.81)
RDW: 12 % (ref 11.5–15.5)
WBC: 5.1 10*3/uL (ref 4.0–10.5)
nRBC: 0 % (ref 0.0–0.2)

## 2019-05-29 MED ORDER — ZOLPIDEM TARTRATE 5 MG PO TABS
5.0000 mg | ORAL_TABLET | Freq: Every evening | ORAL | Status: DC | PRN
  Administered 2019-05-29: 5 mg via ORAL
  Filled 2019-05-29: qty 1

## 2019-05-29 MED ORDER — NICOTINE 14 MG/24HR TD PT24
14.0000 mg | MEDICATED_PATCH | Freq: Every day | TRANSDERMAL | Status: DC
  Administered 2019-05-29 – 2019-05-30 (×2): 14 mg via TRANSDERMAL
  Filled 2019-05-29 (×2): qty 1

## 2019-05-29 MED ORDER — GABAPENTIN 300 MG PO CAPS
300.0000 mg | ORAL_CAPSULE | Freq: Three times a day (TID) | ORAL | Status: DC
  Administered 2019-05-29 – 2019-05-30 (×4): 300 mg via ORAL
  Filled 2019-05-29 (×4): qty 1

## 2019-05-29 MED ORDER — LORAZEPAM 2 MG/ML IJ SOLN
2.0000 mg | INTRAMUSCULAR | Status: DC | PRN
  Administered 2019-05-29 – 2019-05-30 (×2): 2 mg via INTRAVENOUS
  Filled 2019-05-29 (×2): qty 1

## 2019-05-29 NOTE — Progress Notes (Addendum)
Called to room by sitter. Pt was on left side and shaking. This lasted about 20 seconds. Pt did not have any incontincence, no rapid eye movements,   and once he stopped shaking he was able to answer questions appropriately with no confusion. Rapid response nurse was called to give update on patient and for recommendations. Per recommendations will keep monitoring pt.

## 2019-05-29 NOTE — Significant Event (Signed)
Rapid Response Event Note  Overview: Time Called: 1215 Arrival Time: 1220 Event Type: Neurologic  Initial Focused Assessment: Pt resting in bed. Alert and oriented. Pt able to follow commands. Equal strength in all extremities. Vital signs stable. RN reports pt with seizure like activity prior to rapid response call. RN reports administering  2mg  of ativan followed by a second seizure like event.  Vital Signs: HR: 96 NSR BP: 125/90 (101) O2: 97% Room Air RR: 27   Interventions: Pt monitored on tele and observed and assessed by rapid response RN. Some lower extremity shaking occurred within the first ten minutes which then progressed to upper body shaking as well. Pt became drowsy. When eyes assessed during event pt's eyes were up and to the left but made and held eye contact with RN when name was called. Pt then stopped shaking and answered questions again. Vital signs remained stable during event.   Pt informed that if another event occurred he'd be transferred to ICU/SD. Pt was tearful and and anxious about transfer to higher level of care.  Plan of Care (if not transferred): Discussed case with RN, Charge RN and MD. Encouraged RN to keep a close monitor on patient and if another event occurred to give 2mg  ativan and call rapid to transfer patient to SDU. Rapid also instructed RN to set up suction at bedside. MD aware and agrees with decision. Orders for MRI and EEG placed by MD.         Wray Kearns

## 2019-05-29 NOTE — Progress Notes (Signed)
RR called for earlier seizure. MD aware. Respirations increased during this time. Monitoring closely. Pt alert and requesting food.

## 2019-05-29 NOTE — Progress Notes (Signed)
Pt had a seizure at 1157. Called to room. 2 mg Ativan given. Paged MD and awaiting orders for transfer to stepdown. Will continue to monitor.

## 2019-05-29 NOTE — Consult Note (Signed)
Patient with history of polysubstance dependence had back to back seizure episodes today per staff nurse. He is lethargic, delirious and unable to partake in a full psychiatric evaluations. Case discussed with Hospitalist, Dr. Tawanna Solo and I suggested adding Gabapentin 300 mg three time daily due to seizure episodes.   Recommendations: -Patient will benefit from inpatient psychiatric admission when medically stable. -Re-consult psychiatric service when patient is alert and oriented.  Corena Pilgrim, MD Attending Psychiatrist.

## 2019-05-29 NOTE — Progress Notes (Signed)
PROGRESS NOTE    Alex Jacobs  [id number] DOB: April 14, 1966 DOA: 05/27/2019 PCP: Patient, No Pcp Per   Brief Narrative:  Patient is a 53-year male with history of polysubstance abuse including alcohol, IV heroin, methamphetamine who presented to the emergency department with suicidal ideation, willing for detox for polysubstance abuse.  Admitted for suicidal ideation but denied any attempt.  Behavioral health was consulted and patient was planned to be transferred over behavioral health but he had 2 episodes of generalized tonic-clonic seizures most likely secondary to alcohol withdrawal.  Patient was admitted under GI service for seizure monitoring.  Currently he is seizure-free, alert and oriented.  This morning he was calm, cooperative.  Patient is medically stable for transfer to inpatient psychiatry as soon as the bed is available.  Assessment & Plan:   Principal Problem:   Alcohol withdrawal ([place]) Active Problems:   Suicidal ideation   Alcohol withdrawal/alcohol withdrawal seizure: Currently seizure-free.  Continue to monitor CIWA.  Seizure most likely from alcohol withdrawal.  Currently patient is alert, oriented.  Suicidal thoughts with depression, history of PTSD: Plan is to transfer him to psychiatric unit.  He was initially accepted for transfer  to behavioral health.  I did not see any note from attending from psychiatry.  I have reconsulted. We have requested social worker for facilitating transfer to psychiatry unit.  Polysubstance abuse: Patient interested on drug rehabilitation.  Takes heroin, methamphetamine  Chronic alcohol abuse: Drinks a whole bottle of vodka every day.Counselled for cessation.  Chronic thrombocytopenia: Secondary to chronic alcohol abuse.Continue to monitor.         DVT prophylaxis: Lovenox Code Status: Full Family Communication: None present the bedside Disposition Plan: Psychiatric hospital as soon as the bed is available    Consultants: None  Procedures: None  Antimicrobials:  Anti-infectives (From admission, onward)   None      Subjective: Patient seen and examined the bedside this morning.  Hemodynamically stable.  Comfortable.  Admits of having severe depression.  Denies any specific complaints at present  Objective: Vitals:   05/28/19 1802 05/29/19 0012 05/29/19 0100 05/29/19 0635  BP: 138/88 (!) 142/88  (!) 139/92  Pulse: 99 87 86 89  Resp: 15 16  15   Temp: 97.8 F (36.6 C) 98.2 F (36.8 C)  97.9 F (36.6 C)  TempSrc: Oral Oral  Oral  SpO2: 98% 100%  99%  Weight:      Height:        Intake/Output Summary (Last 24 hours) at 05/29/2019 1051 Last data filed at 05/29/2019 0815 Gross per 24 hour  Intake 1900.43 ml  Output 175 ml  Net 1725.43 ml   Filed Weights   05/27/19 1030  Weight: 70.3 kg    Examination:  General exam: Appears calm and comfortable ,Not in distress,average built HEENT:PERRL,Oral mucosa moist, Ear/Nose normal on gross exam Respiratory system: Bilateral equal air entry, normal vesicular breath sounds, no wheezes or crackles  Cardiovascular system: S1 & S2 heard, RRR. No JVD, murmurs, rubs, gallops or clicks. No pedal edema. Gastrointestinal system: Abdomen is nondistended, soft and nontender. No organomegaly or masses felt. Normal bowel sounds heard. Central nervous system: Alert and oriented. No focal neurological deficits. Extremities: No edema, no clubbing ,no cyanosis, distal peripheral pulses palpable. Skin: No rashes, lesions or ulcers,no icterus ,no pallor Psychiatry: Depressed   Data Reviewed: I have personally reviewed following labs and imaging studies  CBC: Recent Labs  Lab 05/27/19 1138 05/28/19 0009 05/29/19 0911  WBC 6.2 7.4 5.1  NEUTROABS  --   --  3.5  HGB 16.0 14.9 14.7  HCT 48.4 43.2 42.7  MCV 93.3 90.8 89.0  PLT 134* 123* [id number]*   Basic Metabolic Panel: Recent Labs  Lab 05/27/19 1005 05/27/19 1138 05/28/19 0009 05/29/19 0545   NA 139 141  --  140  K 3.4* 4.6  --  4.4  CL 108 106  --  111  CO2 22 28  --  19*  GLUCOSE 99 120*  --  97  BUN 19 13  --  16  CREATININE 0.81 0.85 0.71 0.80  CALCIUM 9.3 10.0  --  8.9  MG  --   --  2.1  --   PHOS  --   --  3.9  --    GFR: Estimated Creatinine Clearance: 108.6 mL/min (by C-G formula based on SCr of 0.8 mg/dL). Liver Function Tests: Recent Labs  Lab 05/27/19 1005 05/27/19 1138  AST 31 43*  ALT 35 44  ALKPHOS 52 59  BILITOT 1.1 1.1  PROT 6.8 7.8  ALBUMIN 3.8 4.4   No results for input(s): LIPASE, AMYLASE in the last 168 hours. No results for input(s): AMMONIA in the last 168 hours. Coagulation Profile: No results for input(s): INR, PROTIME in the last 168 hours. Cardiac Enzymes: No results for input(s): CKTOTAL, CKMB, CKMBINDEX, TROPONINI in the last 168 hours. BNP (last 3 results) No results for input(s): PROBNP in the last 8760 hours. HbA1C: No results for input(s): HGBA1C in the last 72 hours. CBG: No results for input(s): GLUCAP in the last 168 hours. Lipid Profile: No results for input(s): CHOL, HDL, LDLCALC, TRIG, CHOLHDL, LDLDIRECT in the last 72 hours. Thyroid Function Tests: No results for input(s): TSH, T4TOTAL, FREET4, T3FREE, THYROIDAB in the last 72 hours. Anemia Panel: No results for input(s): VITAMINB12, FOLATE, FERRITIN, TIBC, IRON, RETICCTPCT in the last 72 hours. Sepsis Labs: No results for input(s): PROCALCITON, LATICACIDVEN in the last 168 hours.  Recent Results (from the past 240 hour(s))  SARS CORONAVIRUS 2 (TAT 6-24 HRS) Nasopharyngeal Nasopharyngeal Swab     Status: None   Collection Time: 05/28/19 12:49 AM   Specimen: Nasopharyngeal Swab  Result Value Ref Range Status   SARS Coronavirus 2 NEGATIVE NEGATIVE Final    Comment: (NOTE) SARS-CoV-2 target nucleic acids are NOT DETECTED. The SARS-CoV-2 RNA is generally detectable in upper and lower respiratory specimens during the acute phase of infection. Negative results do  not preclude SARS-CoV-2 infection, do not rule out co-infections with other pathogens, and should not be used as the sole basis for treatment or other patient management decisions. Negative results must be combined with clinical observations, patient history, and epidemiological information. The expected result is Negative. Fact Sheet for Patients: SugarRoll.be Fact Sheet for Healthcare Providers: https://www.woods-mathews.com/ This test is not yet approved or cleared by the [place] FDA and  has been authorized for detection and/or diagnosis of SARS-CoV-2 by FDA under an Emergency Use Authorization (EUA). This EUA will remain  in effect (meaning this test can be used) for the duration of the COVID-19 declaration under Section 56 4(b)(1) of the Act, 21 U.S.C. section 360bbb-3(b)(1), unless the authorization is terminated or revoked sooner. Performed at [facility], [place] [address], [place], [place] [address]          Radiology Studies: Dg Chest 2 View  Result Date: 05/27/2019 CLINICAL DATA:  53 year old male with history of chest tightness and anxiety. EXAM: CHEST - 2 VIEW COMPARISON:  Chest x-ray  12/07/2018. FINDINGS: Lung volumes are normal. No consolidative airspace disease. No pleural effusions. No pneumothorax. No pulmonary nodule or mass noted. Pulmonary vasculature and the cardiomediastinal silhouette are within normal limits. Orthopedic fixation hardware in the lower cervical spine incidentally noted. IMPRESSION: No radiographic evidence of acute cardiopulmonary disease. Electronically Signed   By: Vinnie Langton M.D.   On: 05/27/2019 11:40   Ct Head Wo Contrast  Result Date: 05/27/2019 CLINICAL DATA:  Seizure, new, nontraumatic, >40 yrs Altered level of consciousness (LOC), unexplained EXAM: CT HEAD WITHOUT CONTRAST TECHNIQUE: Contiguous axial images were obtained from the base of the skull through the vertex without  intravenous contrast. COMPARISON:  None. FINDINGS: Brain: No intracranial hemorrhage, mass effect, or midline shift. No hydrocephalus. The basilar cisterns are patent. No evidence of territorial infarct or acute ischemia. No extra-axial or intracranial fluid collection. Vascular: No hyperdense vessel. Skull: No fracture or focal lesion. Sinuses/Orbits: Paranasal sinuses and mastoid air cells are clear. Tiny mucous retention cyst in left side of sphenoid sinus. The visualized orbits are unremarkable. Other: None. IMPRESSION: No acute finding or explanation for seizure. Electronically Signed   By: Keith Rake M.D.   On: 05/27/2019 19:29        Scheduled Meds: . folic acid  1 mg Oral Daily  . heparin  5,000 Units Subcutaneous Q8H  . LORazepam  0-4 mg Oral Q6H   Followed by  . [START ON 05/30/2019] LORazepam  0-4 mg Oral Q12H  . multivitamin with minerals  1 tablet Oral Daily  . thiamine  100 mg Oral Daily   Or  . thiamine  100 mg Intravenous Daily   Continuous Infusions:   LOS: 1 day    Time spent: 25 mins.More than 50% of that time was spent in counseling and/or coordination of care.      Shelly Coss, MD [facility] Pager [phone]  If 7PM-7AM, please contact night-coverage [email or web address] Password [email or web address] 05/29/2019, 10:51 AM

## 2019-05-29 NOTE — Progress Notes (Signed)
Heard back from [place], [place]. Pt will NOT get MRI today unless MD writes the order as STAT. Have alerted MD that MRI will be tomorrow.

## 2019-05-29 NOTE — Progress Notes (Signed)
Second seizure noted. Legs shaking. At bedside. Rapid eye movement. RR called and is here.

## 2019-05-29 NOTE — Progress Notes (Signed)
RR at bedside and contacted MD. Pt is staying on floor. Will continue to monitor closely for any acute changes or seizures.

## 2019-05-29 NOTE — Progress Notes (Signed)
Called to room by sitter. Noted pt to be holding left arm up but moving it as prompted by nurse. Legs shaking, yet pt able to pickup buttocks and move to edge of bed. After a few seconds, pt answered questions asked by RN. Pts shaking ended at this time. Pt now lying in bed with eyes closed. Reported days seizure activity to receiving nurse. Receiving nurse aware that if behavior continues to transfer pt to ICU.   Closely monitoring pt. Sitter at bedside.

## 2019-05-29 NOTE — Progress Notes (Signed)
Have called and left message with "Megan", MRI tech, that MD has put in order for MRI Brain for this pt. Will await response.

## 2019-05-30 ENCOUNTER — Inpatient Hospital Stay (HOSPITAL_COMMUNITY)
Admit: 2019-05-30 | Discharge: 2019-05-30 | Disposition: A | Discharge status: Home / Self Care | Payer: Medicare Other | Attending: Internal Medicine | Admitting: Internal Medicine

## 2019-05-30 ENCOUNTER — Inpatient Hospital Stay (HOSPITAL_COMMUNITY): Payer: Medicare Other

## 2019-05-30 ENCOUNTER — Encounter (HOSPITAL_COMMUNITY): Payer: Self-pay | Admitting: Registered Nurse

## 2019-05-30 DIAGNOSIS — G4700 Insomnia, unspecified: Secondary | ICD-10-CM

## 2019-05-30 DIAGNOSIS — F331 Major depressive disorder, recurrent, moderate: Secondary | ICD-10-CM

## 2019-05-30 DIAGNOSIS — F4310 Post-traumatic stress disorder, unspecified: Secondary | ICD-10-CM

## 2019-05-30 DIAGNOSIS — R569 Unspecified convulsions: Secondary | ICD-10-CM

## 2019-05-30 MED ORDER — PRAZOSIN HCL 1 MG PO CAPS
1.0000 mg | ORAL_CAPSULE | Freq: Every day | ORAL | Status: DC
  Filled 2019-05-30: qty 1

## 2019-05-30 NOTE — Progress Notes (Signed)
Patient requests to leave AMA. Patient educated but patient states he still wants to leave. Fiancee at bedside also encouraging patient to stay and receive medical but patient states he feels that he has been not provided with what he wants and would like to leave. IV removed. AMA paperwork signed. MD notified. AC notified. Belongings returned to patient.

## 2019-05-30 NOTE — Progress Notes (Signed)
   05/30/19 [place]  Was fall witnessed? Yes  Who witnessed fall? Jeanmarie Hubert  Patients activity before fall ambulating-unassisted  Point of contact head  Was patient injured? Yes (forehead; skin off, dime size)  Follow Up  MD notified Adhikari  Time MD notified [phone]  Family notified Yes - comment (at bedside (fiance))  Time family notified 1647  Simple treatment Ice  Progress note created (see row info) Yes  Adult Fall Risk Assessment  Risk Factor Category (scoring not indicated) Fall has occurred during this admission (document High fall risk)  Patient Fall Risk Level High fall risk  Adult Fall Risk Interventions  Required Bundle Interventions *See Row Information* High fall risk - low, moderate, and high requirements implemented  Additional Interventions Safety Sitter/Safety Rounder  Screening for Fall Injury Risk (To be completed on HIGH fall risk patients) - Assessing Need for Low Bed  Risk For Fall Injury- Low Bed Criteria Previous fall this admission  Will Implement Low Bed and Floor Mats Yes  Screening for Fall Injury Risk (To be completed on HIGH fall risk patients who do not meet crieteria for Low Bed) - Assessing Need for Floor Mats Only  Risk For Fall Injury- Criteria for Floor Mats Confusion/dementia (+NuDESC, CIWA, TBI, etc.)  Will Implement Floor Mats Yes  Vitals  Temp 99.2 F (37.3 C)  Temp Source Oral  BP 128/76  BP Location Right Arm  BP Method Automatic  Patient Position (if appropriate) Lying  Pulse Rate 91  Pulse Rate Source Dinamap  Resp 18  Oxygen Therapy  SpO2 96 %  O2 Device Room Air  Pain Assessment  Pain Scale 0-10  Pain Score 0  Neurological  Neuro (WDL) X  Level of Consciousness Alert  Orientation Level Oriented X4  Cognition Follows commands  Speech Clear  Pupil Assessment  No  Musculoskeletal  Musculoskeletal (WDL) WDL  Assistive Device Front wheel walker  Integumentary  Integumentary (WDL) X  Skin Color Appropriate  for ethnicity  Skin Condition Dry  Skin Integrity Abrasion (left forehead post fall)  Abrasion Location Head  Abrasion Location Orientation Left  Abrasion Intervention Other (Comment) (assessed)   RN called in the room by nurse tech. Patient ambulated to the bathroom. He had a BM and went back to the bathroom; came out and walked to the window seal with the nurse tech at the bedside. Patient had been ambulating in the hall with a walker and the nurse tech. Once at the window seal, patient states he became dizzy and fell forward. States he hit his forehead and has a small area where his skin was peeled off. Vital signs stable. MD has been paged for further orders. Patient states he is in no pain and is able to move all extremities at this time. Low bed and mats have been ordered. Fiancee at the bedside along with the nurse tech. RN will continue to monitor the patient. Patient has been advised to ambulate only with assistance.

## 2019-05-30 NOTE — Significant Event (Signed)
Rapid Response Event Note  Overview: Time Called: [id number] Arrival Time: 1151 Event Type: Neurologic  Initial Focused Assessment:  Called to bedside due to seizure like activities. On arrival patient lying on right side with rhythmic gyrating. Patient able to squeeze my hand and take deep breath on commands. Sternal rub caused patient to stop shaking,  open eyes and track RN, then continue to shake. After RN flushed IV with saline patient stopped shaking. VSS during event. Per nursing staff patient was able to help them turn him to the side during initial "seizure." RRT available as needed for patients care.     Event Summary: Name of Physician Notified: Dessie Coma. MD at 1149  Outcome: Stayed in room and stabalized  Event End Time: [place]

## 2019-05-30 NOTE — TOC Initial Note (Signed)
Transition of Care [facility]) - Initial/Assessment Note    Patient Details  Name: Alex Jacobs MRN: [id number] Date of Birth: 1966-05-10  [facility] CM/SW Contact:    Nila Nephew, LCSW Phone Number: [phone] 05/30/2019, 4:54 PM  Clinical Narrative:      Seeking inpatient psychiatric treatment for pt- pt was first assessed by TTS in ED reporting SI with plan to OD (no attempt made), then following admission pt was unable to participate in psychiatric consult yesterday due to lethargy, but recommendation for inpatient treatment upheld.  Pt medically stable per medical team as of today and CSW made referral to [facility], will refer to other facilities as well if [facility] unable to accept.             Expected Discharge Plan: Psychiatric Hospital Barriers to Discharge: Psych Bed not available   Patient Goals and CMS Choice        Expected Discharge Plan and Services Expected Discharge Plan: [facility]                                              Prior Living Arrangements/Services                       Activities of Daily Living Home Assistive Devices/Equipment: None ADL Screening (condition at time of admission) Patient's cognitive ability adequate to safely complete daily activities?: Yes Is the patient deaf or have difficulty hearing?: No Does the patient have difficulty seeing, even when wearing glasses/contacts?: No Does the patient have difficulty concentrating, remembering, or making decisions?: No Patient able to express need for assistance with ADLs?: Yes Does the patient have difficulty dressing or bathing?: No Independently performs ADLs?: Yes (appropriate for developmental age) Does the patient have difficulty walking or climbing stairs?: No Weakness of Legs: Both Weakness of Arms/Hands: None  Permission Sought/Granted                  Emotional Assessment           Psych Involvement: Yes (comment)(inpatient  psychiatric treatment recommended per note)  Admission diagnosis:  Suicidal ideation [R45.851] Polysubstance abuse ([place]) [F19.10] Alcohol withdrawal seizure without complication ([place]) [id number], R56.9] Alcohol withdrawal ([place]) [F10.239] Patient Active Problem List   Diagnosis Date Noted  . Suicidal ideation 05/28/2019  . Alcohol withdrawal ([place]) 05/27/2019   PCP:  Patient, No Pcp Per Pharmacy:   [facility] [address], [place] [address] Phone: [phone] Fax: [phone]     Social Determinants of Health (SDOH) Interventions    Readmission Risk Interventions No flowsheet data found.

## 2019-05-30 NOTE — Progress Notes (Signed)
EEG complete - results pending 

## 2019-05-30 NOTE — Progress Notes (Signed)
After rapid nurse left room [facility]) was still on floor and patient began what was called a possible seizure.  It appeared to be leg and torso tremors because he remained oriented x4 and had muscle control.  Gave 2mg  of ativan to calm severe tremors and nausea.  Patient currently not having tremors or nausea.
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
## 2019-05-30 NOTE — Progress Notes (Signed)
RN was called to the patient's room while patient was having a seizure after he arrived back to the floor from having an MRI. Patient was lying on his side. Vital signs charted. Rapid response called and MD notified.

## 2019-05-30 NOTE — Progress Notes (Signed)
PROGRESS NOTE    Alex Jacobs  [id number] DOB: 1965-08-26 DOA: 05/27/2019 PCP: Alex Jacobs, No Pcp Per   Brief Narrative:  Alex Jacobs is a 84-year male with history of polysubstance abuse including alcohol, IV heroin, methamphetamine who presented to the emergency department with suicidal ideation, willing for detox for polysubstance abuse.  Admitted for suicidal ideation but denied any attempt.  Behavioral health was consulted and Alex Jacobs was planned to be transferred over behavioral health but he it was reported that he had episodes of generalized tonic-clonic seizures most likely secondary to alcohol withdrawal.  Alex Jacobs was admitted under [facility] service  for seizure monitoring.  Currently he is alert and oriented.  This morning he was calm, cooperative.  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  Alex Jacobs is medically stable for transfer to inpatient psychiatry as soon as the bed is available.  Assessment & Plan:   Principal Problem:   Alcohol withdrawal ([place]) Active Problems:   Suicidal ideation   Alcohol withdrawal/alcohol withdrawal seizure:Seizures suspected to be from  alcohol withdrawal.   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  MRI of the brain did not show any acute intracranial abnormalities.  EEG pending. Started on gabapentin as per psychiatry.  Suicidal thoughts with depression, history of PTSD: Plan is to transfer him to psychiatric unit.  He was initially accepted for transfer  to behavioral health. We have requested social worker for facilitating transfer to psychiatry unit.  Polysubstance abuse: Alex Jacobs interested on drug rehabilitation.  Takes heroin, methamphetamine  Chronic alcohol abuse:  Drinks a whole bottle of vodka every day.Counselled for cessation.  Chronic thrombocytopenia: Secondary to chronic alcohol abuse.Continue to monitor.         DVT prophylaxis: Lovenox Code Status: Full Family Communication: None present the bedside Disposition Plan: Psychiatric hospital as soon as the bed is available   Consultants: None  Procedures: None  Antimicrobials:  Anti-infectives (From admission, onward)   None      Subjective: Alex Jacobs seen and examined the bedside this morning.  Currently hemodynamically stable.  Alert and oriented during my evaluation.  Around noon time, rapid response was called for possible seizure episode but he remained alert and oriented with no postictal state.  Suspecting pseudoseizures.  Objective: Vitals:   05/30/19 1013 05/30/19 1149 05/30/19 1150 05/30/19 1237  BP: 129/85 (!) 137/118  133/88  Pulse: 100 (!) 155 (!) 103 93  Resp: (!) 30  19 20   Temp: 98.4 F (36.9 C)   98.5 F (36.9 C)  TempSrc: Oral   Oral  SpO2: 96% 98%  97%  Weight:      Height:        Intake/Output Summary (Last 24 hours) at 05/30/2019 1251 Last data filed at 05/30/2019 [id number] Gross per 24 hour  Intake 440 ml  Output 150 ml  Net 290 ml   Filed Weights   05/27/19 1030  Weight: 70.3 kg    Examination:  General exam: Appears calm and comfortable ,Not in distress,average built Respiratory system: Bilateral equal air entry, normal vesicular breath sounds, no wheezes or crackles  Cardiovascular system: S1 & S2 heard, RRR. No JVD, murmurs, rubs, gallops or clicks. No pedal edema. Gastrointestinal system: Abdomen is nondistended, soft and nontender.  Central nervous system:  Alert and oriented. No focal neurological deficits. Extremities: No edema, no clubbing ,no cyanosis, distal peripheral pulses palpable.    Data Reviewed: I have personally reviewed following labs and imaging studies  CBC: Recent Labs  Lab 05/27/19 1138 05/28/19 0009 05/29/19  0911  WBC 6.2 7.4 5.1  NEUTROABS  --   --  3.5  HGB 16.0 14.9 14.7  HCT 48.4 43.2 42.7  MCV 93.3 90.8 89.0  PLT 134* 123* 123XX123*   Basic Metabolic Panel: Recent Labs  Lab 05/27/19 1005 05/27/19 1138 05/28/19 0009 05/29/19 0545  NA 139 141  --  140  K 3.4* 4.6  --  4.4  CL 108 106  --  111  CO2 22 28  --  19*  GLUCOSE 99 120*  --  97  BUN 19 13  --  16  CREATININE 0.81 0.85 0.71 0.80  CALCIUM 9.3 10.0  --  8.9  MG  --   --  2.1  --   PHOS  --   --  3.9  --    GFR: Estimated Creatinine Clearance: 108.6 mL/min (by C-G formula based on SCr of 0.8 mg/dL). Liver Function Tests: Recent Labs  Lab 05/27/19 1005 05/27/19 1138  AST 31 43*  ALT 35 44  ALKPHOS 52 59  BILITOT 1.1 1.1  PROT 6.8 7.8  ALBUMIN 3.8 4.4   No results for input(s): LIPASE, AMYLASE in the last 168 hours. No results for input(s): AMMONIA in the last 168 hours. Coagulation Profile: No results for input(s): INR, PROTIME in the last 168 hours. Cardiac Enzymes: No results for input(s): CKTOTAL, CKMB, CKMBINDEX, TROPONINI in the last 168 hours. BNP (last 3 results) No results for input(s): PROBNP in the last 8760 hours. HbA1C: No results for input(s): HGBA1C in the last 72 hours. CBG: No results for input(s): GLUCAP in the last 168 hours. Lipid Profile: No results for input(s): CHOL, HDL, LDLCALC, TRIG, CHOLHDL, LDLDIRECT in the last 72 hours. Thyroid Function Tests: No results for input(s): TSH, T4TOTAL, FREET4, T3FREE, THYROIDAB in the last 72 hours. Anemia Panel: No results for input(s): VITAMINB12, FOLATE, FERRITIN, TIBC, IRON, RETICCTPCT in the last 72 hours. Sepsis Labs: No results for input(s): PROCALCITON, LATICACIDVEN in the last 168 hours.  Recent Results (from the past 240 hour(s))  SARS CORONAVIRUS 2 (TAT 6-24 HRS) Nasopharyngeal Nasopharyngeal Swab     Status: None   Collection Time: 05/28/19 12:49 AM   Specimen: Nasopharyngeal Swab  Result Value Ref Range Status   SARS Coronavirus 2  NEGATIVE NEGATIVE Final    Comment: (NOTE) SARS-CoV-2 target nucleic acids are NOT DETECTED. The SARS-CoV-2 RNA is generally detectable in upper and lower respiratory specimens during the acute phase of infection. Negative results do not preclude SARS-CoV-2 infection, do not rule out co-infections with other pathogens, and should not be used as the sole basis for treatment or other Alex Jacobs management decisions. Negative results must be combined with clinical observations, Alex Jacobs history, and epidemiological information. The expected result is Negative. Fact Sheet for Patients: SugarRoll.be Fact Sheet for Healthcare Providers: https://www.woods-mathews.com/ This test is not yet approved or cleared by the [place] FDA and  has been authorized for detection and/or diagnosis of SARS-CoV-2 by FDA under an Emergency Use Authorization (EUA). This EUA will remain  in effect (meaning this test can be used) for the duration of the COVID-19 declaration under Section 56 4(b)(1) of the Act, 21 U.S.C. section 360bbb-3(b)(1), unless the authorization is terminated or revoked sooner. Performed at [facility]  [facility], [place] [address] [place], [place] [address]          Radiology Studies: Mr Brain Wo Contrast  Result Date: 05/30/2019 CLINICAL DATA:  New onset seizure.  Alcohol and drug use. EXAM: MRI HEAD WITHOUT CONTRAST TECHNIQUE: Multiplanar, multiecho pulse sequences of the brain and surrounding structures were obtained without intravenous contrast. COMPARISON:  Head CT 05/27/2019 FINDINGS: Brain: The brain has a normal appearance without evidence of malformation, atrophy, old or acute small or large vessel infarction, mass lesion, hemorrhage, hydrocephalus or extra-axial collection. No mesial temporal lesion is seen. Vascular: Major vessels at the base of the brain show flow. Venous sinuses appear patent. Skull and upper cervical spine: Normal.  Sinuses/Orbits: Clear/normal. Other: None significant. IMPRESSION: Within normal limits. No acute or reversible finding. No specific abnormality seen to explain seizure. Electronically Signed   By: Nelson Chimes M.D.   On: 05/30/2019 11:34        Scheduled Meds: . folic acid  1 mg Oral Daily  . gabapentin  300 mg Oral TID  . heparin  5,000 Units Subcutaneous Q8H  . LORazepam  0-4 mg Oral Q12H  . multivitamin with minerals  1 tablet Oral Daily  . nicotine  14 mg Transdermal Daily  . thiamine  100 mg Oral Daily   Or  . thiamine  100 mg Intravenous Daily   Continuous Infusions:   LOS: 2 days    Time spent: 25 mins.More than 50% of that time was spent in counseling and/or coordination of care.      Shelly Coss, MD [facility] Pager [phone]  If 7PM-7AM, please contact night-coverage [email or web address] Password [email or web address] 05/30/2019, 12:51 PM

## 2019-05-30 NOTE — Procedures (Signed)
ELECTROENCEPHALOGRAM REPORT   Patient: Alex Jacobs       Room #: [id number] EEG No. ID: [id number] Age: 53 y.o.        Sex: male Referring Physician: Amrit Report Date:  05/30/2019        Interpreting Physician: Alexis Goodell  History: Romari Larew is an 53 y.o. male with history of polysubstance abuse and seizures  Medications:  Neurontin, Thiamine  Conditions of Recording:  This is a 21 channel routine scalp EEG performed with bipolar and monopolar montages arranged in accordance to the international 10/20 system of electrode placement. One channel was dedicated to EKG recording.  The patient is in the awake, drowsy and asleep states.  Description:  The waking background activity consists of a low voltage, symmetrical, fairly well organized, 8 Hz alpha activity, seen from the parieto-occipital and posterior temporal regions.  Low voltage fast activity, poorly organized, is seen anteriorly and is at times superimposed on more posterior regions.  A mixture of theta and alpha rhythms are seen from the central and temporal regions. The patient is drowsy and sleep for the majority of the recording.  He drowses with slowing to irregular, low voltage theta and beta activity.   The patient goes in to a light sleep with symmetrical sleep spindles, vertex central sharp transients and irregular slow activity.   No epileptiform activity is noted.   Hyperventilation and intermittent photic stimulation were not performed.  IMPRESSION: Normal electroencephalogram, awake and asleep. There are no focal lateralizing or epileptiform features.   Alexis Goodell, MD Neurology [phone] 05/30/2019, 3:06 PM

## 2019-06-01 NOTE — Discharge Summary (Signed)
Physician Discharge Summary  Alex Jacobs [id number] DOB: [birth year] DOA: 05/27/2019  PCP: Patient, No Pcp Per  Admit date: 05/27/2019 Discharge date: 05/30/19 Admitted From: Home Disposition:  Home  Discharge Condition:Signed AMA Brief/Interim Summary: Signed AMA.Please see my today's progress note for further information  Discharge Diagnoses:  Principal Problem:   PTSD (post-traumatic stress disorder) Active Problems:   Alcohol withdrawal ([place])   Suicidal ideation   MDD (major depressive disorder), recurrent episode, moderate ([place])   Insomnia    Discharge Instructions   Allergies as of 05/30/2019      Reactions   Toradol [ketorolac Tromethamine]    anxiety      Medication List    ASK your doctor about these medications   ibuprofen 200 MG tablet Commonly known as: ADVIL Take 400 mg by mouth every 6 (six) hours as needed for fever, headache or moderate pain.       Allergies  Allergen Reactions  . Toradol [Ketorolac Tromethamine]     anxiety    Consultations:  None   Procedures/Studies: Dg Chest 2 View  Result Date: 05/27/2019 CLINICAL DATA:  53 year old male with history of chest tightness and anxiety. EXAM: CHEST - 2 VIEW COMPARISON:  Chest x-ray 12/07/2018. FINDINGS: Lung volumes are normal. No consolidative airspace disease. No pleural effusions. No pneumothorax. No pulmonary nodule or mass noted. Pulmonary vasculature and the cardiomediastinal silhouette are within normal limits. Orthopedic fixation hardware in the lower cervical spine incidentally noted. IMPRESSION: No radiographic evidence of acute cardiopulmonary disease. Electronically Signed   By: Vinnie Langton M.D.   On: 05/27/2019 11:40   Ct Head Wo Contrast  Result Date: 05/27/2019 CLINICAL DATA:  Seizure, new, nontraumatic, >40 yrs Altered level of consciousness (LOC), unexplained EXAM: CT HEAD WITHOUT CONTRAST TECHNIQUE: Contiguous axial images were obtained from the base of the  skull through the vertex without intravenous contrast. COMPARISON:  None. FINDINGS: Brain: No intracranial hemorrhage, mass effect, or midline shift. No hydrocephalus. The basilar cisterns are patent. No evidence of territorial infarct or acute ischemia. No extra-axial or intracranial fluid collection. Vascular: No hyperdense vessel. Skull: No fracture or focal lesion. Sinuses/Orbits: Paranasal sinuses and mastoid air cells are clear. Tiny mucous retention cyst in left side of sphenoid sinus. The visualized orbits are unremarkable. Other: None. IMPRESSION: No acute finding or explanation for seizure. Electronically Signed   By: Keith Rake M.D.   On: 05/27/2019 19:29   Mr Brain Wo Contrast  Result Date: 05/30/2019 CLINICAL DATA:  New onset seizure.  Alcohol and drug use. EXAM: MRI HEAD WITHOUT CONTRAST TECHNIQUE: Multiplanar, multiecho pulse sequences of the brain and surrounding structures were obtained without intravenous contrast. COMPARISON:  Head CT 05/27/2019 FINDINGS: Brain: The brain has a normal appearance without evidence of malformation, atrophy, old or acute small or large vessel infarction, mass lesion, hemorrhage, hydrocephalus or extra-axial collection. No mesial temporal lesion is seen. Vascular: Major vessels at the base of the brain show flow. Venous sinuses appear patent. Skull and upper cervical spine: Normal. Sinuses/Orbits: Clear/normal. Other: None significant. IMPRESSION: Within normal limits. No acute or reversible finding. No specific abnormality seen to explain seizure. Electronically Signed   By: Nelson Chimes M.D.   On: 05/30/2019 11:34       Subjective:   Discharge Exam: Vitals:   05/30/19 1621 05/30/19 1645  BP: 134/89 128/76  Pulse: 89 91  Resp: 18 18  Temp: 99 F (37.2 C) 99.2 F (37.3 C)  SpO2: 98% 96%   Vitals:   05/30/19 1150  05/30/19 1237 05/30/19 1621 05/30/19 1645  BP:  133/88 134/89 128/76  Pulse: (!) 103 93 89 91  Resp: 19 20 18 18   Temp:   98.5 F (36.9 C) 99 F (37.2 C) 99.2 F (37.3 C)  TempSrc:  Oral Oral Oral  SpO2:  97% 98% 96%  Weight:      Height:          The results of significant diagnostics from this hospitalization (including imaging, microbiology, ancillary and laboratory) are listed below for reference.     Microbiology: Recent Results (from the past 240 hour(s))  SARS CORONAVIRUS 2 (TAT 6-24 HRS) Nasopharyngeal Nasopharyngeal Swab     Status: None   Collection Time: 05/28/19 12:49 AM   Specimen: Nasopharyngeal Swab  Result Value Ref Range Status   SARS Coronavirus 2 NEGATIVE NEGATIVE Final    Comment: (NOTE) SARS-CoV-2 target nucleic acids are NOT DETECTED. The SARS-CoV-2 RNA is generally detectable in upper and lower respiratory specimens during the acute phase of infection. Negative results do not preclude SARS-CoV-2 infection, do not rule out co-infections with other pathogens, and should not be used as the sole basis for treatment or other patient management decisions. Negative results must be combined with clinical observations, patient history, and epidemiological information. The expected result is Negative. Fact Sheet for Patients: SugarRoll.be Fact Sheet for Healthcare Providers: https://www.woods-mathews.com/ This test is not yet approved or cleared by the [place] FDA and  has been authorized for detection and/or diagnosis of SARS-CoV-2 by FDA under an Emergency Use Authorization (EUA). This EUA will remain  in effect (meaning this test can be used) for the duration of the COVID-19 declaration under Section 56 4(b)(1) of the Act, 21 U.S.C. section 360bbb-3(b)(1), unless the authorization is terminated or revoked sooner. Performed at [facility], [place] [address] [place], [place] [address]      Labs: BNP (last 3 results) No results for input(s): BNP in the last 8760 hours. Basic Metabolic Panel: Recent Labs  Lab  05/27/19 1005 05/27/19 1138 05/28/19 0009 05/29/19 0545  NA 139 141  --  140  K 3.4* 4.6  --  4.4  CL 108 106  --  111  CO2 22 28  --  19*  GLUCOSE 99 120*  --  97  BUN 19 13  --  16  CREATININE 0.81 0.85 0.71 0.80  CALCIUM 9.3 10.0  --  8.9  MG  --   --  2.1  --   PHOS  --   --  3.9  --    Liver Function Tests: Recent Labs  Lab 05/27/19 1005 05/27/19 1138  AST 31 43*  ALT 35 44  ALKPHOS 52 59  BILITOT 1.1 1.1  PROT 6.8 7.8  ALBUMIN 3.8 4.4   No results for input(s): LIPASE, AMYLASE in the last 168 hours. No results for input(s): AMMONIA in the last 168 hours. CBC: Recent Labs  Lab 05/27/19 1138 05/28/19 0009 05/29/19 0911  WBC 6.2 7.4 5.1  NEUTROABS  --   --  3.5  HGB 16.0 14.9 14.7  HCT 48.4 43.2 42.7  MCV 93.3 90.8 89.0  PLT 134* 123* 117*   Cardiac Enzymes: No results for input(s): CKTOTAL, CKMB, CKMBINDEX, TROPONINI in the last 168 hours. BNP: Invalid input(s): POCBNP CBG: No results for input(s): GLUCAP in the last 168 hours. D-Dimer No results for input(s): DDIMER in the last 72 hours. Hgb A1c No results for input(s): HGBA1C in the last 72 hours. Lipid  Profile No results for input(s): CHOL, HDL, LDLCALC, TRIG, CHOLHDL, LDLDIRECT in the last 72 hours. Thyroid function studies No results for input(s): TSH, T4TOTAL, T3FREE, THYROIDAB in the last 72 hours.  Invalid input(s): FREET3 Anemia work up No results for input(s): VITAMINB12, FOLATE, FERRITIN, TIBC, IRON, RETICCTPCT in the last 72 hours. Urinalysis    Component Value Date/Time   COLORURINE YELLOW 05/28/2019 0555   APPEARANCEUR CLEAR 05/28/2019 0555   LABSPEC 1.016 05/28/2019 0555   PHURINE 6.0 05/28/2019 0555   GLUCOSEU NEGATIVE 05/28/2019 0555   HGBUR NEGATIVE 05/28/2019 0555   BILIRUBINUR NEGATIVE 05/28/2019 0555   KETONESUR NEGATIVE 05/28/2019 0555   PROTEINUR NEGATIVE 05/28/2019 0555   NITRITE NEGATIVE 05/28/2019 0555   LEUKOCYTESUR NEGATIVE 05/28/2019 0555   Sepsis  Labs Invalid input(s): PROCALCITONIN,  WBC,  LACTICIDVEN Microbiology Recent Results (from the past 240 hour(s))  SARS CORONAVIRUS 2 (TAT 6-24 HRS) Nasopharyngeal Nasopharyngeal Swab     Status: None   Collection Time: 05/28/19 12:49 AM   Specimen: Nasopharyngeal Swab  Result Value Ref Range Status   SARS Coronavirus 2 NEGATIVE NEGATIVE Final    Comment: (NOTE) SARS-CoV-2 target nucleic acids are NOT DETECTED. The SARS-CoV-2 RNA is generally detectable in upper and lower respiratory specimens during the acute phase of infection. Negative results do not preclude SARS-CoV-2 infection, do not rule out co-infections with other pathogens, and should not be used as the sole basis for treatment or other patient management decisions. Negative results must be combined with clinical observations, patient history, and epidemiological information. The expected result is Negative. Fact Sheet for Patients: SugarRoll.be Fact Sheet for Healthcare Providers: https://www.woods-mathews.com/ This test is not yet approved or cleared by the [place] FDA and  has been authorized for detection and/or diagnosis of SARS-CoV-2 by FDA under an Emergency Use Authorization (EUA). This EUA will remain  in effect (meaning this test can be used) for the duration of the COVID-19 declaration under Section 56 4(b)(1) of the Act, 21 U.S.C. section 360bbb-3(b)(1), unless the authorization is terminated or revoked sooner. Performed at [facility], [place] [address] [place], [place] [address]     Please note: You were cared for by a hospitalist during your hospital stay. Once you are discharged, your primary care physician will handle any further medical issues. Please note that NO REFILLS for any discharge medications will be authorized once you are discharged, as it is imperative that you return to your primary care physician (or establish a relationship with a  primary care physician if you do not have one) for your post hospital discharge needs so that they can reassess your need for medications and monitor your lab values.    Time coordinating discharge: 40 minutes  SIGNED:   Shelly Coss, MD  [facility] 06/01/2019, 5:46 PM Pager [phone]  If 7PM-7AM, please contact night-coverage [email or web address] Password [email or web address]

## 2019-08-09 ENCOUNTER — Emergency Department (HOSPITAL_COMMUNITY): Payer: Self-pay

## 2019-08-09 ENCOUNTER — Other Ambulatory Visit: Payer: Self-pay

## 2019-08-09 ENCOUNTER — Emergency Department (HOSPITAL_COMMUNITY)
Admission: EM | Admit: 2019-08-09 | Discharge: 2019-08-10 | Disposition: A | Discharge status: Home / Self Care | Payer: Self-pay | Attending: Emergency Medicine | Admitting: Emergency Medicine

## 2019-08-09 ENCOUNTER — Encounter (HOSPITAL_COMMUNITY): Payer: Self-pay | Admitting: Emergency Medicine

## 2019-08-09 DIAGNOSIS — F17210 Nicotine dependence, cigarettes, uncomplicated: Secondary | ICD-10-CM | POA: Insufficient documentation

## 2019-08-09 DIAGNOSIS — R1013 Epigastric pain: Secondary | ICD-10-CM | POA: Insufficient documentation

## 2019-08-09 HISTORY — DX: Unspecified viral hepatitis C without hepatic coma: B19.20

## 2019-08-09 LAB — CBC
HCT: 47.5 % (ref 39.0–52.0)
Hemoglobin: 15.9 g/dL (ref 13.0–17.0)
MCH: 30.4 pg (ref 26.0–34.0)
MCHC: 33.5 g/dL (ref 30.0–36.0)
MCV: 90.8 fL (ref 80.0–100.0)
Platelets: 105 10*3/uL — ABNORMAL LOW (ref 150–400)
RBC: 5.23 MIL/uL (ref 4.22–5.81)
RDW: 13.9 % (ref 11.5–15.5)
WBC: 7.2 10*3/uL (ref 4.0–10.5)
nRBC: 0 % (ref 0.0–0.2)

## 2019-08-09 LAB — BASIC METABOLIC PANEL
Anion gap: 10 (ref 5–15)
BUN: 15 mg/dL (ref 6–20)
CO2: 25 mmol/L (ref 22–32)
Calcium: 9.4 mg/dL (ref 8.9–10.3)
Chloride: 106 mmol/L (ref 98–111)
Creatinine, Ser: 0.85 mg/dL (ref 0.61–1.24)
GFR calc Af Amer: >60 mL/min (ref >60)
GFR calc non Af Amer: >60 mL/min (ref >60)
Glucose, Bld: 74 mg/dL (ref 70–99)
Potassium: 4 mmol/L (ref 3.5–5.1)
Sodium: 141 mmol/L (ref 135–145)

## 2019-08-09 LAB — PROTIME-INR
INR: 1 (ref 0.8–1.2)
Prothrombin Time: 12.6 seconds (ref 11.4–15.2)

## 2019-08-09 LAB — TROPONIN I (HIGH SENSITIVITY): Troponin I (High Sensitivity): <2 ng/L (ref <18)

## 2019-08-09 MED ORDER — SODIUM CHLORIDE 0.9% FLUSH: 3.0000 mL | Freq: Once | INTRAVENOUS | Status: DC

## 2019-08-09 NOTE — ED Triage Notes (Signed)
Patient reports central chest pain/tightness onset yesterday with SOB and emesis , patient added low back pain , denies fever or chills .

## 2019-08-10 ENCOUNTER — Emergency Department (HOSPITAL_COMMUNITY): Payer: Self-pay

## 2019-08-10 LAB — LIPASE, BLOOD: Lipase: 27 U/L (ref 11–51)

## 2019-08-10 LAB — TROPONIN I (HIGH SENSITIVITY): Troponin I (High Sensitivity): <2 ng/L (ref <18)

## 2019-08-10 LAB — HEPATIC FUNCTION PANEL
ALT: 33 U/L (ref 0–44)
AST: 39 U/L (ref 15–41)
Albumin: 4.1 g/dL (ref 3.5–5.0)
Alkaline Phosphatase: 46 U/L (ref 38–126)
Bilirubin, Direct: 0.4 mg/dL — ABNORMAL HIGH (ref 0.0–0.2)
Indirect Bilirubin: 0.7 mg/dL (ref 0.3–0.9)
Total Bilirubin: 1.1 mg/dL (ref 0.3–1.2)
Total Protein: 7 g/dL (ref 6.5–8.1)

## 2019-08-10 MED ORDER — IOHEXOL 350 MG/ML SOLN
100.0000 mL | Freq: Once | INTRAVENOUS | Status: AC | PRN
  Administered 2019-08-10: 100 mL via INTRAVENOUS

## 2019-08-10 MED ORDER — ACETAMINOPHEN 500 MG PO TABS
500.0000 mg | ORAL_TABLET | Freq: Once | ORAL | Status: DC
  Filled 2019-08-10: qty 1

## 2019-08-10 NOTE — ED Notes (Signed)
Pt stating that he wants to leave due to needing treatment.

## 2019-08-10 NOTE — ED Notes (Signed)
Pt walked back to chair, did not leave.

## 2019-08-10 NOTE — ED Notes (Signed)
Pt states that he is unable to lay flat for an MRI at this time. States he would like to be discharged. EDP made aware.

## 2019-08-10 NOTE — ED Provider Notes (Signed)
[facility] EMERGENCY DEPARTMENT Provider Note   CSN: [id number] Arrival date & time: 08/09/19  2040     History Chief Complaint  Patient presents with  . Chest Pain    Alex Jacobs is a 53 y.o. male.  HPI Patient presents with pain in his mid chest and upper abdomen.  He has had for days now.  States he has lost weight due to it.  States he has had chills.  States he has had some blood in the urine.  Has vomited but no emesis there.  States he has hepatitis C.  States he had blood work drawn yesterday that showed worsening liver problems.  Former heavy drinker but has not drank recently.  States he is also worried about pancreatitis.  Previous IV drug user.  States he also has mid back pain.  States he is on disability due to neck pain.  Has had chills without fevers.  Patient states his abdomen is also gotten larger.    Past Medical History:  Diagnosis Date  . Addiction ([place])   . Hepatitis C     Patient Active Problem List   Diagnosis Date Noted  . MDD (major depressive disorder), recurrent episode, moderate ([place]) 05/30/2019  . PTSD (post-traumatic stress disorder) 05/30/2019  . Insomnia 05/30/2019  . Suicidal ideation 05/28/2019  . Alcohol withdrawal ([place]) 05/27/2019    Past Surgical History:  Procedure Laterality Date  . BACK SURGERY         Family History  Problem Relation Age of Onset  . Cancer Mother   . Cancer Father     Social History   Tobacco Use  . Smoking status: Current Every Day Smoker    Packs/day: 1.00    Types: Cigarettes  . Smokeless tobacco: Never Used  Substance Use Topics  . Alcohol use: Yes    Comment: daily use  a fifth a day  . Drug use: Yes    Types: Methamphetamines    Comment: heroin, fentanyl    Home Medications Prior to Admission medications   Medication Sig Start Date End Date Taking? Authorizing Provider  Ibuprofen 200 MG CAPS Take 400 mg by mouth every 6 (six) hours as needed for fever, headache or  moderate pain.    Yes [provider]  tetrahydrozoline 0.05 % ophthalmic solution Place 1 drop into both eyes as needed (dry eye).    Yes [provider]    Allergies    Toradol [ketorolac tromethamine]  Review of Systems   Review of Systems  Constitutional: Positive for appetite change and chills.  HENT: Negative for congestion.   Respiratory: Negative for shortness of breath.   Cardiovascular: Positive for chest pain.  Gastrointestinal: Positive for abdominal pain.  Genitourinary: Negative for flank pain.  Musculoskeletal: Positive for back pain.  Skin: Negative for rash.  Neurological: Positive for weakness.  Psychiatric/Behavioral: Negative for confusion.    Physical Exam Updated Vital Signs BP 117/79   Pulse (!) 53   Temp 98.4 F (36.9 C) (Oral)   Resp 17   Ht 5\' 9"  (1.753 m)   Wt 77.1 kg   SpO2 96%   BMI 25.10 kg/m   Physical Exam Vitals and nursing note reviewed.  HENT:     Head: Atraumatic.  Eyes:     Comments: No scleral icterus.  Cardiovascular:     Rate and Rhythm: Normal rate and regular rhythm.  Pulmonary:     Effort: No tachypnea.     Breath sounds:  No wheezing, rhonchi or rales.  Chest:     Chest wall: No tenderness.  Abdominal:     Comments: Tenderness over upper abdomen.  No ecchymosis.  No hernia.  Musculoskeletal:     Cervical back: Neck supple.     Right lower leg: No edema.     Left lower leg: No edema.     Comments: Tenderness over mid to lower thoracic spine.  Neurological:     Mental Status: He is alert.     ED Results / Procedures / Treatments   Labs (all labs ordered are listed, but only abnormal results are displayed) Labs Reviewed  CBC - Abnormal; Notable for the following components:      Result Value   Platelets 105 (*)    All other components within normal limits  HEPATIC FUNCTION PANEL - Abnormal; Notable for the following components:   Bilirubin, Direct 0.4 (*)    All other components within  normal limits  BASIC METABOLIC PANEL  PROTIME-INR  LIPASE, BLOOD  TROPONIN I (HIGH SENSITIVITY)  TROPONIN I (HIGH SENSITIVITY)    EKG EKG Interpretation  Date/Time:  Tuesday August 09 2019 20:45:16 EST Ventricular Rate:  64 PR Interval:  174 QRS Duration: 84 QT Interval:  416 QTC Calculation: 429 R Axis:   71 Text Interpretation: Sinus rhythm with marked sinus arrhythmia Otherwise normal ECG Confirmed by Davonna Belling [phone]) on 08/10/2019 8:31:01 AM   Radiology DG Chest 2 View  Result Date: 08/09/2019 CLINICAL DATA:  Chest tightness. EXAM: CHEST - 2 VIEW COMPARISON:  05/27/2019 FINDINGS: The heart size and mediastinal contours are within normal limits. Both lungs are clear. The visualized skeletal structures are unremarkable. IMPRESSION: No active cardiopulmonary disease. Electronically Signed   By: Constance Holster M.D.   On: 08/09/2019 21:26   CT ABDOMEN PELVIS W CONTRAST  Result Date: 08/10/2019 CLINICAL DATA:  Epigastric pain EXAM: CT ABDOMEN AND PELVIS WITH CONTRAST TECHNIQUE: Multidetector CT imaging of the abdomen and pelvis was performed using the standard protocol following bolus administration of intravenous contrast. CONTRAST:  170mL OMNIPAQUE IOHEXOL 350 MG/ML SOLN COMPARISON:  None. FINDINGS: Lower chest: Lung bases are clear. No effusions. Heart is normal size. Hepatobiliary: Small low-density lesion anteriorly within the right hepatic lobe measures 8 mm. Diffuse fatty infiltration of the liver. Gallbladder unremarkable. Common bile duct is prominent measuring up to 12 mm in the pancreatic head. No visible ductal stone. No intrahepatic ductal dilatation. Pancreas: No focal abnormality or ductal dilatation. Spleen: Spleen is enlarged with a craniocaudal length of 14.6 cm. No focal abnormality. Adrenals/Urinary Tract: No adrenal abnormality. No focal renal abnormality. No stones or hydronephrosis. Urinary bladder is unremarkable. Stomach/Bowel: Prior appendectomy.  Stomach, large and small bowel grossly unremarkable. Vascular/Lymphatic: Aortic atherosclerosis. No enlarged abdominal or pelvic lymph nodes. Reproductive: No visible focal abnormality. Other: No free fluid or free air. Musculoskeletal: No acute bony abnormality. IMPRESSION: Diffuse fatty infiltration of the liver. Focal 8 mm low-density lesion anteriorly within the liver is difficult to characterize due to its small size. This could be further characterized with MRI if felt clinically indicated. Splenomegaly. Dilated common bile duct measuring up to 12 mm without visible ductal stone or obstructing mass lesion. This could be further evaluated with MRCP. Aortic atherosclerosis. Electronically Signed   By: Rolm Baptise M.D.   On: 08/10/2019 12:05    Procedures Procedures (including critical care time)  Medications Ordered in ED Medications  sodium chloride flush (NS) 0.9 % injection 3 mL (has no administration in  time range)  acetaminophen (TYLENOL) tablet 500 mg (500 mg Oral Not Given 08/10/19 1224)  iohexol (OMNIPAQUE) 350 MG/ML injection 100 mL (100 mLs Intravenous Contrast Given 08/10/19 1149)    ED Course  I have reviewed the triage vital signs and the nursing notes.  Pertinent labs & imaging results that were available during my care of the patient were reviewed by me and considered in my medical decision making (see chart for details).    MDM Rules/Calculators/A&P                      Patient with upper abdominal pain.  States he had blood work done yesterday that showed severe hepatitis.  LFTs done and reassuring.  Although I do not know viral load or other markers of his hepatitis C.  Lipase is normal.  CT scan done and showed some nonspecific findings including irregular area in the liver and mildly elevated common bile duct in the pancreas.  I plan to get an MRI of his thoracic spine due to mid back pain and IV drug use.  However patient refuses this.  States he will not be able to  sleep lay still.  Think it is reasonable at this time special with reassuring blood work otherwise. Will discharge home to follow-up with gastroenterology. Final Clinical Impression(s) / ED Diagnoses Final diagnoses:  Epigastric pain    Rx / DC Orders ED Discharge Orders    None       Davonna Belling, MD 08/10/19 1248

## 2019-08-10 NOTE — ED Notes (Signed)
Pt not wanting to stay for discharge vitals at this time

## 2019-08-10 NOTE — ED Notes (Signed)
Patient Alert and oriented to baseline. Stable and ambulatory to baseline. Patient verbalized understanding of the discharge instructions.  Patient belongings were taken by the patient.   

## 2019-08-10 NOTE — Discharge Instructions (Signed)
Follow-up with gastroenterology for the hepatitis C, and swelling in the liver and on the tube coming off.

## 2019-09-02 ENCOUNTER — Emergency Department (HOSPITAL_COMMUNITY): Payer: Medicare Other

## 2019-09-02 ENCOUNTER — Encounter (HOSPITAL_COMMUNITY): Payer: Self-pay | Admitting: Emergency Medicine

## 2019-09-02 ENCOUNTER — Emergency Department (HOSPITAL_COMMUNITY)
Admission: EM | Admit: 2019-09-02 | Discharge: 2019-09-02 | Disposition: A | Discharge status: Home / Self Care | Payer: Medicare Other | Attending: Emergency Medicine | Admitting: Emergency Medicine

## 2019-09-02 DIAGNOSIS — Z5321 Procedure and treatment not carried out due to patient leaving prior to being seen by health care provider: Secondary | ICD-10-CM | POA: Diagnosis not present

## 2019-09-02 DIAGNOSIS — R042 Hemoptysis: Secondary | ICD-10-CM | POA: Diagnosis present

## 2019-09-02 LAB — COMPREHENSIVE METABOLIC PANEL
ALT: 40 U/L (ref 0–44)
AST: 41 U/L (ref 15–41)
Albumin: 4.5 g/dL (ref 3.5–5.0)
Alkaline Phosphatase: 53 U/L (ref 38–126)
Anion gap: 10 (ref 5–15)
BUN: 15 mg/dL (ref 6–20)
CO2: 27 mmol/L (ref 22–32)
Calcium: 9.6 mg/dL (ref 8.9–10.3)
Chloride: 105 mmol/L (ref 98–111)
Creatinine, Ser: 1.04 mg/dL (ref 0.61–1.24)
GFR calc Af Amer: >60 mL/min (ref >60)
GFR calc non Af Amer: >60 mL/min (ref >60)
Glucose, Bld: 82 mg/dL (ref 70–99)
Potassium: 4.8 mmol/L (ref 3.5–5.1)
Sodium: 142 mmol/L (ref 135–145)
Total Bilirubin: 1.1 mg/dL (ref 0.3–1.2)
Total Protein: 7.5 g/dL (ref 6.5–8.1)

## 2019-09-02 LAB — CBC
HCT: 48.4 % (ref 39.0–52.0)
Hemoglobin: 16.2 g/dL (ref 13.0–17.0)
MCH: 30.2 pg (ref 26.0–34.0)
MCHC: 33.5 g/dL (ref 30.0–36.0)
MCV: 90.1 fL (ref 80.0–100.0)
Platelets: 109 10*3/uL — ABNORMAL LOW (ref 150–400)
RBC: 5.37 MIL/uL (ref 4.22–5.81)
RDW: 13.2 % (ref 11.5–15.5)
WBC: 6.5 10*3/uL (ref 4.0–10.5)
nRBC: 0 % (ref 0.0–0.2)

## 2019-09-02 NOTE — ED Triage Notes (Signed)
Pt here from home with c/o coughing up blood , pt has  Been coughing up blood for about 4 days , pt does have hep c and just quite drinking about 1 month ago

## 2019-10-22 ENCOUNTER — Other Ambulatory Visit: Payer: Self-pay | Admitting: Orthopedic Surgery

## 2019-10-22 DIAGNOSIS — M545 Low back pain, unspecified: Secondary | ICD-10-CM

## 2019-10-22 DIAGNOSIS — M542 Cervicalgia: Secondary | ICD-10-CM

## 2019-10-22 DIAGNOSIS — M5416 Radiculopathy, lumbar region: Secondary | ICD-10-CM

## 2019-11-17 ENCOUNTER — Ambulatory Visit
Admission: RE | Admit: 2019-11-17 | Discharge: 2019-11-17 | Disposition: A | Discharge status: Home / Self Care | Payer: Medicare Other | Source: Ambulatory Visit | Attending: Orthopedic Surgery | Admitting: Orthopedic Surgery

## 2019-11-17 ENCOUNTER — Other Ambulatory Visit: Payer: Self-pay

## 2019-11-17 DIAGNOSIS — M542 Cervicalgia: Secondary | ICD-10-CM

## 2019-11-17 DIAGNOSIS — M5416 Radiculopathy, lumbar region: Secondary | ICD-10-CM

## 2019-11-17 DIAGNOSIS — M545 Low back pain, unspecified: Secondary | ICD-10-CM

## 2019-11-30 ENCOUNTER — Other Ambulatory Visit: Payer: Self-pay | Admitting: Orthopedic Surgery

## 2019-12-12 NOTE — Progress Notes (Deleted)
Office Visit Note  Patient: Alex Jacobs             Date of Birth: [birth year]           MRN: [id number]             PCP: [name], FNP Referring: [name], * Visit Date: 12/20/2019 Occupation: '@GUAROCC'$ @  Subjective:  No chief complaint on file.   History of Present Illness: Alex Jacobs is a 54 y.o. male ***   Activities of Daily Living:  Patient reports morning stiffness for *** {minute/hour:19697}.   Patient {ACTIONS;DENIES/REPORTS:21021675::"Denies"} nocturnal pain.  Difficulty dressing/grooming: {ACTIONS;DENIES/REPORTS:21021675::"Denies"} Difficulty climbing stairs: {ACTIONS;DENIES/REPORTS:21021675::"Denies"} Difficulty getting out of chair: {ACTIONS;DENIES/REPORTS:21021675::"Denies"} Difficulty using hands for taps, buttons, cutlery, and/or writing: {ACTIONS;DENIES/REPORTS:21021675::"Denies"}  No Rheumatology ROS completed.   PMFS History:  Patient Active Problem List   Diagnosis Date Noted  . MDD (major depressive disorder), recurrent episode, moderate ([place]) 05/30/2019  . PTSD (post-traumatic stress disorder) 05/30/2019  . Insomnia 05/30/2019  . Suicidal ideation 05/28/2019  . Alcohol withdrawal ([place]) 05/27/2019    Past Medical History:  Diagnosis Date  . Addiction ([place])   . Hepatitis C     Family History  Problem Relation Age of Onset  . Cancer Mother   . Cancer Father    Past Surgical History:  Procedure Laterality Date  . BACK SURGERY     Social History   Social History Narrative  . Not on file    There is no immunization history on file for this patient.   Objective: Vital Signs: There were no vitals taken for this visit.   Physical Exam   Musculoskeletal Exam: ***  CDAI Exam: CDAI Score: -- Patient Global: --; Provider Global: -- Swollen: --; Tender: -- Joint Exam 12/20/2019   No joint exam has been documented for this visit   There is currently no information documented on the homunculus. Go to the  Rheumatology activity and complete the homunculus joint exam.  Investigation: No additional findings.  Imaging: CT CERVICAL SPINE WO CONTRAST  Result Date: 11/18/2019 CLINICAL DATA:  Initial evaluation for neck pain radiating into both shoulders and arms with bilateral hand numbness. History of prior surgery in 2016. EXAM: CT CERVICAL SPINE WITHOUT CONTRAST TECHNIQUE: Multidetector CT imaging of the cervical spine was performed without intravenous contrast. Multiplanar CT image reconstructions were also generated. COMPARISON:  Comparison made with concomitant MRI of the cervical spine performed on the same day. FINDINGS: Alignment: Straightening of the normal cervical lordosis. No listhesis or subluxation. Skull base and vertebrae: Skull base intact and within normal limits. Normal C1-2 articulations well maintained. Patient status post prior ACDF at C4-C7. Hardware appears intact and well aligned. No findings to suggest loosening or failure. Solid arthrodesis at the C5-6 level. Vertebral body height maintained without acute or chronic fracture. Small benign bone island noted within the C3 vertebral body. No other discrete or worrisome osseous lesions. No evidence for acute or chronic fracture. Soft tissues and spinal canal: Paraspinous soft tissues within normal limits. No abnormal prevertebral edema. Few calcified tonsilliths noted within the palatine tonsils bilaterally. Disc levels: C2-3: Minimal uncovertebral hypertrophy without significant disc bulge. No canal or foraminal stenosis. C3-4: Mild diffuse disc bulge, slightly eccentric to the left. Superimposed bilateral uncovertebral hypertrophy. Flattening of the ventral thecal sac without significant spinal stenosis. Moderate left greater than right C4 foraminal narrowing. C4-5: Prior fusion. Residual posterior endplate osseous ridging flattens the ventral thecal sac without significant spinal stenosis. Residual left-sided uncovertebral hypertrophy with  resultant severe left C5 foraminal stenosis. Right neural foramen is patent. C5-6: Prior fusion with solid arthrodesis. No residual canal or foraminal stenosis. C6-7: Prior fusion. Residual endplate osseous ridging mildly flattens the ventral thecal sac without significant spinal stenosis. Residual left-sided uncovertebral hypertrophy with persistent moderate left C7 foraminal stenosis. No significant right foraminal narrowing. C7-T1: Minimal disc bulge. Mild facet hypertrophy. No canal or foraminal stenosis. Upper chest: Unremarkable. Other: None. IMPRESSION: 1. Prior ACDF at C4-C7 with solid arthrodesis at C5-6. No adverse features. No residual spinal stenosis at these levels. 2. Residual left-sided uncovertebral hypertrophy at C4-5 and C6-7 with resultant moderate to severe left C5 and C7 foraminal stenosis. 3. Disc bulging with uncovertebral hypertrophy at C3-4 with resultant moderate left greater than right C4 foraminal stenosis. Electronically Signed   By: Jeannine Boga M.D.   On: 11/18/2019 05:50   MR CERVICAL SPINE WO CONTRAST  Result Date: 11/18/2019 CLINICAL DATA:  Initial evaluation for neck pain with radiation into both shoulders and arms, bilateral hand numbness. History of prior surgery in 2016. EXAM: MRI CERVICAL SPINE WITHOUT CONTRAST TECHNIQUE: Multiplanar, multisequence MR imaging of the cervical spine was performed. No intravenous contrast was administered. COMPARISON:  Comparison made with concomitant CT of the cervical spine performed on the same day. FINDINGS: Alignment: Straightening of the normal cervical lordosis. No listhesis. Vertebrae: Susceptibility artifact from prior ACDF at C4-C7. There appears to be solid arthrodesis at C5-6. Vertebral body height maintained without evidence for acute or chronic fracture. Bone marrow signal intensity normal. No discrete or worrisome osseous lesions. No abnormal marrow edema. Cord: Signal intensity within the cervical spinal cord is normal.  Normal cord caliber morphology. Posterior Fossa, vertebral arteries, paraspinal tissues: Partially visualized brain within normal limits. Retro cerebellar cyst versus mega cisterna magna noted. Craniocervical junction normal. Paraspinous and prevertebral soft tissues within normal limits. Normal intravascular flow voids seen within the vertebral arteries bilaterally. Disc levels: C2-C3: Minimal uncovertebral hypertrophy without significant disc bulge. No canal or foraminal stenosis. C3-C4: Mild diffuse disc bulge with bilateral uncovertebral hypertrophy. Mild flattening of the ventral thecal sac without significant spinal stenosis. Moderate left greater than right C4 foraminal stenosis. C4-C5: Prior fusion. Mild residual posterior endplate osseous ridging flattens the ventral thecal sac without significant spinal stenosis. Residual left-sided uncovertebral hypertrophy with resultant severe left C5 foraminal stenosis. Right neural foramen is patent. C5-C6: Prior fusion. No residual canal stenosis. Foramina are widely patent. C6-C7: Prior fusion. Residual endplate osseous ridging mildly flattens the ventral thecal sac without significant spinal stenosis. Residual left-sided uncovertebral hypertrophy with persistent moderate left C7 foraminal stenosis. No significant right foraminal narrowing. C7-T1: Minimal disc bulge. Bilateral facet hypertrophy. No significant canal or foraminal stenosis. T1-2: Tiny left paracentral disc protrusion minimally indents the left ventral thecal sac. No significant stenosis. IMPRESSION: 1. Prior ACDF at C4-C7 with solid arthrodesis at C5-6. No residual spinal stenosis at these levels. 2. Residual left-sided uncovertebral hypertrophy at C4-5 and C6-7 with resultant moderate to severe left C5 and C7 foraminal stenosis. 3. Disc bulge with uncovertebral hypertrophy at C3-4 with resultant moderate left greater than right C4 foraminal stenosis. Electronically Signed   By: Jeannine Boga  M.D.   On: 11/18/2019 05:44   MR LUMBAR SPINE WO CONTRAST  Result Date: 11/18/2019 CLINICAL DATA:  Initial evaluation for lower back pain with radiation into the bilateral buttocks, with weakness and numbness in both legs. EXAM: MRI LUMBAR SPINE WITHOUT CONTRAST TECHNIQUE: Multiplanar, multisequence MR imaging of the lumbar spine was performed. No intravenous  contrast was administered. COMPARISON:  None available. FINDINGS: Segmentation: Standard. Lowest well-formed disc space labeled the L5-S1 level. Alignment: Mild straightening of the normal cervical lordosis. No significant listhesis. Vertebrae: Vertebral body height maintained without evidence for acute or chronic fracture. Bone marrow signal intensity mildly heterogeneous but within normal limits. Few scattered benign hemangiomata noted. No worrisome osseous lesions. Discogenic reactive endplate changes present about the L4-5 and L5-S1 interspaces. No other abnormal marrow edema. Conus medullaris and cauda equina: Conus extends to the L1 level. Conus and cauda equina appear normal. Paraspinal and other soft tissues: Unremarkable. Disc levels: L1-2:  Unremarkable. L2-3:  Negative interspace. Mild facet hypertrophy. No stenosis. L3-4:  Negative interspace. Mild facet hypertrophy. No stenosis. L4-5: Chronic intervertebral disc space narrowing with diffuse disc bulge and disc desiccation. Mild reactive endplate changes. Superimposed broad-based left paracentral to foraminal disc protrusion with associated annular fissure (series 5, image 28). Protruding disc closely approximates the exiting left L4 nerve root without frank neural impingement. Superimposed bilateral facet hypertrophy. Resultant mild canal with mild left lateral recess stenosis. Mild to moderate left L4 foraminal narrowing. L5-S1: Chronic intervertebral disc space narrowing with diffuse disc bulge and disc desiccation. Mild reactive endplate changes. Superimposed shallow central disc protrusion  mildly indents the ventral thecal sac, slightly eccentric to the left (series 5, image 34). Superimposed mild to moderate facet hypertrophy. Resultant mild lateral recess narrowing bilaterally, slightly worse on the left. Moderate right with mild to moderate left L5 foraminal narrowing related to disc bulge, reactive endplate changes, and facet hypertrophy. IMPRESSION: 1. Broad based left paracentral to foraminal disc protrusion at L4-5 with resultant mild to moderate left foraminal and left lateral recess stenosis. Either the left L4 or descending L5 nerve roots could be affected. 2. Disc bulging with small central disc protrusion and facet hypertrophy at L5-S1 with resultant mild left greater than right lateral recess stenosis. 3. Moderate right with mild moderate left L5 foraminal stenosis related to disc bulge, reactive endplate changes, and facet degeneration. Electronically Signed   By: Jeannine Boga M.D.   On: 11/18/2019 05:59    Recent Labs: Lab Results  Component Value Date   WBC 6.5 09/02/2019   HGB 16.2 09/02/2019   PLT 109 (L) 09/02/2019   NA 142 09/02/2019   K 4.8 09/02/2019   CL 105 09/02/2019   CO2 27 09/02/2019   GLUCOSE 82 09/02/2019   BUN 15 09/02/2019   CREATININE 1.04 09/02/2019   BILITOT 1.1 09/02/2019   ALKPHOS 53 09/02/2019   AST 41 09/02/2019   ALT 40 09/02/2019   PROT 7.5 09/02/2019   ALBUMIN 4.5 09/02/2019   CALCIUM 9.6 09/02/2019   GFRAA >60 09/02/2019    Speciality Comments: No specialty comments available.  Procedures:  No procedures performed Allergies: Toradol [ketorolac tromethamine]   Assessment / Plan:     Visit Diagnoses: Polyarthralgia - 09/27/19:ANA positive, dsDNA 12, RNP-, smith-, Ro-, La-, RF 15, ESR 8, uric acid 4.5  Myalgia  Chronic midline low back pain with left-sided sciatica  Chronic midline thoracic back pain  Chronic neck pain  Skin cancer of face  Suicidal ideation  MDD (major depressive disorder), recurrent  episode, moderate ([facility])  PTSD (post-traumatic stress disorder)  Alcohol withdrawal syndrome with complication ([place])  Orders: No orders of the defined types were placed in this encounter.  No orders of the defined types were placed in this encounter.   Face-to-face time spent with patient was *** minutes. Greater than 50% of time was spent in  counseling and coordination of care.  Follow-Up Instructions: No follow-ups on file.   Ofilia Neas, PA-C  Note - This record has been created using Dragon software.  Chart creation errors have been sought, but may not always  have been located. Such creation errors do not reflect on  the standard of medical care.

## 2019-12-20 ENCOUNTER — Ambulatory Visit: Payer: Medicare Other | Admitting: Rheumatology

## 2019-12-20 DIAGNOSIS — F4310 Post-traumatic stress disorder, unspecified: Secondary | ICD-10-CM

## 2019-12-20 DIAGNOSIS — G8929 Other chronic pain: Secondary | ICD-10-CM

## 2019-12-20 DIAGNOSIS — F10239 Alcohol dependence with withdrawal, unspecified: Secondary | ICD-10-CM

## 2019-12-20 DIAGNOSIS — C44300 Unspecified malignant neoplasm of skin of unspecified part of face: Secondary | ICD-10-CM

## 2019-12-20 DIAGNOSIS — M791 Myalgia, unspecified site: Secondary | ICD-10-CM

## 2019-12-20 DIAGNOSIS — M2550 Pain in unspecified joint: Secondary | ICD-10-CM

## 2019-12-20 DIAGNOSIS — R45851 Suicidal ideations: Secondary | ICD-10-CM

## 2019-12-20 DIAGNOSIS — F331 Major depressive disorder, recurrent, moderate: Secondary | ICD-10-CM

## 2019-12-20 NOTE — Progress Notes (Signed)
[facility] [address] [place] [address] [place] [address] Phone: [phone] Fax: [phone]      Your procedure is scheduled on Dec 29, 2019.  Report to [facility] Main Entrance "A" at 5:30 A.M., and check in at the Admitting office.  Call this number if you have problems the morning of surgery:  [phone]  Call [phone] if you have any questions prior to your surgery date Monday-Friday 8am-4pm    Remember:  Do not eat after midnight the night before your surgery  You may drink clear liquids until 4:30 the morning of your surgery.   Clear liquids allowed are: Water, Non-Citrus Juices (without pulp), Carbonated Beverages, Clear Tea, Black Coffee Only, and Gatorade  Please complete your PRE-SURGERY ENSURE that was provided to you by 4:30 the morning of surgery.  Please, if able, drink it in one setting. DO NOT SIP.    Take these medicines the morning of surgery with A SIP OF WATER: Oxycodone - if needed for pain  As of today, STOP taking any Aspirin (unless otherwise instructed by your surgeon) and Aspirin containing products, Aleve, Naproxen, Ibuprofen, Motrin, Advil, Goody's, BC's, all herbal medications, fish oil, and all vitamins.                      Do not wear jewelry, make up, or nail polish            Do not wear lotions, powders, perfumes or deodorant.            Do not shave 48 hours prior to surgery.              Do not bring valuables to the hospital.            [facility] is not responsible for any belongings or valuables.  Do NOT Smoke (Tobacco/Vapping) or drink Alcohol 24 hours prior to your procedure If you use a CPAP at night, you may bring all equipment for your overnight stay.   Contacts, glasses, dentures or bridgework may not be worn into surgery.      For patients admitted to the hospital, discharge time will be determined by your treatment team.   Patients discharged the day of  surgery will not be allowed to drive home, and someone needs to stay with them for 24 hours.    Special instructions:   [facility]- Preparing For Surgery  Before surgery, you can play an important role. Because skin is not sterile, your skin needs to be as free of germs as possible. You can reduce the number of germs on your skin by washing with CHG (chlorahexidine gluconate) Soap before surgery.  CHG is an antiseptic cleaner which kills germs and bonds with the skin to continue killing germs even after washing.    Oral Hygiene is also important to reduce your risk of infection.  Remember - BRUSH YOUR TEETH THE MORNING OF SURGERY WITH YOUR REGULAR TOOTHPASTE  Please do not use if you have an allergy to CHG or antibacterial soaps. If your skin becomes reddened/irritated stop using the CHG.  Do not shave (including legs and underarms) for at least 48 hours prior to first CHG shower. It is OK to shave your face.  Please follow these instructions carefully.   1. Shower the NIGHT BEFORE SURGERY and the MORNING OF SURGERY with CHG Soap.   2. If you chose to wash your hair, wash  your hair first as usual with your normal shampoo.  3. After you shampoo, rinse your hair and body thoroughly to remove the shampoo.  4. Use CHG as you would any other liquid soap. You can apply CHG directly to the skin and wash gently with a scrungie or a clean washcloth.   5. Apply the CHG Soap to your body ONLY FROM THE NECK DOWN.  Do not use on open wounds or open sores. Avoid contact with your eyes, ears, mouth and genitals (private parts). Wash Face and genitals (private parts)  with your normal soap.   6. Wash thoroughly, paying special attention to the area where your surgery will be performed.  7. Thoroughly rinse your body with warm water from the neck down.  8. DO NOT shower/wash with your normal soap after using and rinsing off the CHG Soap.  9. Pat yourself dry with a CLEAN TOWEL.  10. Wear CLEAN  PAJAMAS to bed the night before surgery, wear comfortable clothes the morning of surgery  11. Place CLEAN SHEETS on your bed the night of your first shower and DO NOT SLEEP WITH PETS.   Day of Surgery:   Do not apply any deodorants/lotions.  Please wear clean clothes to the hospital/surgery center.   Remember to brush your teeth WITH YOUR REGULAR TOOTHPASTE.   Please read over the following fact sheets that you were given.

## 2019-12-21 ENCOUNTER — Other Ambulatory Visit: Payer: Self-pay

## 2019-12-21 ENCOUNTER — Encounter (HOSPITAL_COMMUNITY)
Admission: RE | Admit: 2019-12-21 | Discharge: 2019-12-21 | Disposition: A | Discharge status: Home / Self Care | Payer: Medicare Other | Source: Ambulatory Visit | Attending: Orthopedic Surgery | Admitting: Orthopedic Surgery

## 2019-12-21 ENCOUNTER — Encounter (HOSPITAL_COMMUNITY): Payer: Self-pay

## 2019-12-21 DIAGNOSIS — F17210 Nicotine dependence, cigarettes, uncomplicated: Secondary | ICD-10-CM | POA: Diagnosis not present

## 2019-12-21 DIAGNOSIS — Z01818 Encounter for other preprocedural examination: Secondary | ICD-10-CM | POA: Diagnosis not present

## 2019-12-21 DIAGNOSIS — Z981 Arthrodesis status: Secondary | ICD-10-CM | POA: Diagnosis not present

## 2019-12-21 DIAGNOSIS — M069 Rheumatoid arthritis, unspecified: Secondary | ICD-10-CM | POA: Insufficient documentation

## 2019-12-21 DIAGNOSIS — B1920 Unspecified viral hepatitis C without hepatic coma: Secondary | ICD-10-CM | POA: Diagnosis not present

## 2019-12-21 DIAGNOSIS — F17200 Nicotine dependence, unspecified, uncomplicated: Secondary | ICD-10-CM | POA: Insufficient documentation

## 2019-12-21 LAB — URINALYSIS, ROUTINE W REFLEX MICROSCOPIC
Bacteria, UA: NONE SEEN
Bilirubin Urine: NEGATIVE
Glucose, UA: NEGATIVE mg/dL
Ketones, ur: NEGATIVE mg/dL
Leukocytes,Ua: NEGATIVE
Nitrite: NEGATIVE
Protein, ur: NEGATIVE mg/dL
Specific Gravity, Urine: 1.014 (ref 1.005–1.030)
pH: 5 (ref 5.0–8.0)

## 2019-12-21 LAB — CBC WITH DIFFERENTIAL/PLATELET
Abs Immature Granulocytes: 0.01 10*3/uL (ref 0.00–0.07)
Basophils Absolute: 0 10*3/uL (ref 0.0–0.1)
Basophils Relative: 1 %
Eosinophils Absolute: 0.1 10*3/uL (ref 0.0–0.5)
Eosinophils Relative: 2 %
HCT: 48.5 % (ref 39.0–52.0)
Hemoglobin: 15.8 g/dL (ref 13.0–17.0)
Immature Granulocytes: 0 %
Lymphocytes Relative: 27 %
Lymphs Abs: 1.4 10*3/uL (ref 0.7–4.0)
MCH: 29.8 pg (ref 26.0–34.0)
MCHC: 32.6 g/dL (ref 30.0–36.0)
MCV: 91.5 fL (ref 80.0–100.0)
Monocytes Absolute: 0.3 10*3/uL (ref 0.1–1.0)
Monocytes Relative: 7 %
Neutro Abs: 3.3 10*3/uL (ref 1.7–7.7)
Neutrophils Relative %: 63 %
Platelets: 122 10*3/uL — ABNORMAL LOW (ref 150–400)
RBC: 5.3 MIL/uL (ref 4.22–5.81)
RDW: 13.2 % (ref 11.5–15.5)
WBC: 5.2 10*3/uL (ref 4.0–10.5)
nRBC: 0 % (ref 0.0–0.2)

## 2019-12-21 LAB — COMPREHENSIVE METABOLIC PANEL
ALT: 44 U/L (ref 0–44)
AST: 44 U/L — ABNORMAL HIGH (ref 15–41)
Albumin: 4.2 g/dL (ref 3.5–5.0)
Alkaline Phosphatase: 55 U/L (ref 38–126)
Anion gap: 9 (ref 5–15)
BUN: 15 mg/dL (ref 6–20)
CO2: 25 mmol/L (ref 22–32)
Calcium: 9.5 mg/dL (ref 8.9–10.3)
Chloride: 103 mmol/L (ref 98–111)
Creatinine, Ser: 0.9 mg/dL (ref 0.61–1.24)
GFR calc Af Amer: >60 mL/min (ref >60)
GFR calc non Af Amer: >60 mL/min (ref >60)
Glucose, Bld: 104 mg/dL — ABNORMAL HIGH (ref 70–99)
Potassium: 4.7 mmol/L (ref 3.5–5.1)
Sodium: 137 mmol/L (ref 135–145)
Total Bilirubin: 0.8 mg/dL (ref 0.3–1.2)
Total Protein: 7.5 g/dL (ref 6.5–8.1)

## 2019-12-21 LAB — ABO/RH: ABO/RH(D): AB POS

## 2019-12-21 LAB — TYPE AND SCREEN
ABO/RH(D): AB POS
Antibody Screen: NEGATIVE

## 2019-12-21 LAB — SURGICAL PCR SCREEN
MRSA, PCR: POSITIVE — AB
Staphylococcus aureus: POSITIVE — AB

## 2019-12-21 LAB — APTT: aPTT: 30 seconds (ref 24–36)

## 2019-12-21 LAB — PROTIME-INR
INR: 0.9 (ref 0.8–1.2)
Prothrombin Time: 12.2 seconds (ref 11.4–15.2)

## 2019-12-21 NOTE — Progress Notes (Signed)
PCP - Irwin Brakeman, MD Cardiologist - pt denies  Pt goes to [place] states he goes to the facility on [address] for his liver and the location on [place] for his RA. Pt does not know his provider's names.  Pt stated he will be seeing Daine Floras, MD for a skin cancer removal procedure tomorrow.    Pt stated in PAT that he's had a stress test and echo "about 6 months or a year ago or more". Pt states he had "the thing they do when they put something in your IV to see what your heart does? And they put jelly on my chest to look at my heart too I think." Pt does not recall where he had this done, pt hx does not indicate any stress test or echo in the past. Pt stated he went to the ED somewhere because he thought he was having chest pain, but he was sent home because his discomfort went away and the tests were normal. No records were requested because pt has no idea where he went for these tests.   Chest x-ray - 09/02/19 EKG - 08/10/19 Stress Test - see above ECHO - see above Cardiac Cath - pt denies    Blood Thinner Instructions: n/a Aspirin Instructions:n/a  ERAS Protcol - yes PRE-SURGERY Ensure or G2- ensure  COVID TEST- 12/26/19  Coronavirus Screening  Have you experienced the following symptoms:  Cough yes/no: No Fever (>100.3F)  yes/no: No Runny nose yes/no: No Sore throat yes/no: No Difficulty breathing/shortness of breath  yes/no: No  Have you or a family member traveled in the last 14 days and where? yes/no: No   If the patient indicates "YES" to the above questions, their PAT will be rescheduled to limit the exposure to others and, the surgeon will be notified. THE PATIENT WILL NEED TO BE ASYMPTOMATIC FOR 14 DAYS.   If the patient is not experiencing any of these symptoms, the PAT nurse will instruct them to NOT bring anyone with them to their appointment since they may have these symptoms or traveled as well.   Please remind your patients and families that  hospital visitation restrictions are in effect and the importance of the restrictions.    Anesthesia review: yes, hx of alcohol and substance abuse - denies any alcohol/substance use in PAT.   Patient denies shortness of breath, fever, cough and chest pain at PAT appointment   All instructions explained to the patient, with a verbal understanding of the material. Patient agrees to go over the instructions while at home for a better understanding. Patient also instructed to self quarantine after being tested for COVID-19. The opportunity to ask questions was provided.

## 2019-12-22 ENCOUNTER — Encounter (HOSPITAL_COMMUNITY): Payer: Self-pay | Admitting: Vascular Surgery

## 2019-12-22 NOTE — Progress Notes (Addendum)
Anesthesia Chart Review:  Case: [id number] Date/Time: 12/29/19 0715   Procedure: POSTERIOR CERVICAL DECOMPRESSION FUSION CERVICAL 4- CERVICAL 7 WITH INSTRUMENTATION AND ALLOGRAFT (N/A )   Anesthesia type: General   Pre-op diagnosis: CERVICAL NONUNION   Location: [facility] OR ROOM 05 / [name]   Surgeons: Phylliss Bob, MD      DISCUSSION: Patient is a 54 year old male scheduled for the above procedure.  History includes smoking, RA, hepatitis C, polysubstance abuse/addiction, back surgery, appendectomy, prior ACDF C4-7. At 12/21/19 PAT visit, he denied any current alcohol/substance abuse use (except cigarettes).  He relocated to [facility] from [facility] ~ April 2020. He is now established with [name], but he has also had multiple ED visits over the past year including:  - ED visit 08/10/19 for mid chest and upper abdominal pain. Reported recent evaluation for elevated LFTs and worsening liver issues/Hepatitis C. He was worried about pancreatitis. Denied recent alcohol use. LFTs, troponin, and lipase remarkable.  CT scan done and showed some "nonspecific findings including irregular area in the liver and mildly elevated common bile duct in the pancreas." He also reported back pain, but refused MRI of the T-spine. Follow-up with GI recommended.  - ED visit 05/27/19 for SI and wanting to detox. Reported using opioids (heroin/fentanyl), methamphetamines, & alcohol (about a fifth of vodka) daily. Last use the day before and was beginning to have withdrawal symptoms including chest pain. Work-up not suggestive of ACS. Placed on CIWA and admitted to [facility], but signed out [facility] 05/30/19. - ED visit 04/08/19 for abdominal pain radiating up into chest. Labs showed Creatinine of 1.4 and mildly elevated LFTs. No free air on abdominal xray and [place] negative for cholelithiasis or cholecystitis. Discharged with omeprazole and Carafate with instructions for primary care or GI follow-up. - ED visit 12/07/18 for  dyspnea. ED work-up "fairly unremarkable aside from thrombocytopenia". There was some concern for anginal equivalent, so stress testing discussed, although patient wanted to discuss further with primary care.   Awaiting records from [facility] (primary care, GI, and any cardiac records). In the interim, I called and spoke with Mr. Alex Jacobs. He said that in the past year, primary issue (outside of his neck) had been with abdominal pain and not chest pain; however, he describes an overnight stay at [facility] approximately 15-18 months ago for chest pain following the deaths of his parents and brother all within an approximate 3 month time span. He says cardiac testing was negative, and he was diagnosed with panic attack and given Valium in the ER. He is no longer having abdominal and chest pain issues. He has been off of alcohol and illicit drugs. He does take Percocet for pain. He is seeing a GI specialist with [facility] who reportedly talked with him about starting treatment for Hepatitis C following recovery from surgery. He denied history of paracentesis. He is not followed by cardiology, but is seeing primary care regularly--more frequently because he is on Percocet. He denied SOB. He has neck pain with numbness in his arms but has still been able to stay active including laying flooring in his house last week. He also goes up and down a flight of stairs multiple times a day without issue.   Record request sent to [facility] to get cardiac testing results. If not received then he will require medical clearance for surgery. Discussed available information with anesthesiologist Renold Don, MD. Chart will be left for follow-up records.  Presurgical COVID-19 test is scheduled for 12/26/2019.  ADDENDUM 12/23/19 10:12 AM: Alex Jacobs records are still pending. [facility] records received. ED visit for chest and abdominal pain  (epigastric) x 3 days. ED provider felt symptoms were GI in etiology. No record of stress test or echocardiogram with [facility]. Their notes mention history of a cardiac cath (~ 2015), although patient denied cardiac cath at PAT. He denied known CAD when I spoke with him. Will continue to follow-up [facility] records, but in the interim, discussed anesthesiologist input with [name]. She will follow-up with [name].    VS: BP (!) 150/97   Pulse 97   Temp 36.7 C (Oral)   Resp 18   Ht 5\' 9"  (1.753 m)   Wt 82.7 kg   SpO2 100%   BMI 26.94 kg/m     PROVIDERS: [name], FNP is listed as PCP [name]). He also reported seeing GI (for liver) at the [place] location and rheumatology at [facility] location. He denied having a cardiologist, but reported testing within the past 18 months.   - [name], MD is dermatologist [facility]). Reported patient is getting a skin cancer excision on 12/22/19.    LABS: Labs reviewed: Acceptable for surgery. (all labs ordered are listed, but only abnormal results are displayed)  Labs Reviewed  SURGICAL PCR SCREEN - Abnormal; Notable for the following components:      Result Value   MRSA, PCR POSITIVE (*)    Staphylococcus aureus POSITIVE (*)    All other components within normal limits  CBC WITH DIFFERENTIAL/PLATELET - Abnormal; Notable for the following components:   Platelets 122 (*)    All other components within normal limits  COMPREHENSIVE METABOLIC PANEL - Abnormal; Notable for the following components:   Glucose, Bld 104 (*)    AST 44 (*)    All other components within normal limits  URINALYSIS, ROUTINE W REFLEX MICROSCOPIC - Abnormal; Notable for the following components:   Hgb urine dipstick SMALL (*)    All other components within normal limits  APTT  PROTIME-INR  TYPE AND SCREEN  ABO/RH     IMAGES: MRI C-spine 11/17/19: IMPRESSION: 1. Prior ACDF at C4-C7 with solid arthrodesis  at C5-6. No residual spinal stenosis at these levels. 2. Residual left-sided uncovertebral hypertrophy at C4-5 and C6-7 with resultant moderate to severe left C5 and C7 foraminal stenosis. 3. Disc bulge with uncovertebral hypertrophy at C3-4 with resultant moderate left greater than right C4 foraminal stenosis.  MRI L-spine 11/17/19:  IMPRESSION: 1. Broad based left paracentral to foraminal disc protrusion at L4-5 with resultant mild to moderate left foraminal and left lateral recess stenosis. Either the left L4 or descending L5 nerve roots could be affected. 2. Disc bulging with small central disc protrusion and facet hypertrophy at L5-S1 with resultant mild left greater than right lateral recess stenosis. 3. Moderate right with mild moderate left L5 foraminal stenosis related to disc bulge, reactive endplate changes, and facet degeneration.  CXR 09/02/19: FINDINGS: Lungs are clear. Heart size and pulmonary vascularity are normal. No adenopathy. There is postoperative change in the lower cervical region. IMPRESSION: Lungs clear.  Cardiac silhouette normal.  No evident adenopathy.  CT abd/pelvis 08/10/19: IMPRESSION: - Diffuse fatty infiltration of the liver. Focal 8 mm low-density lesion anteriorly within the liver is difficult to characterize due to its small size. This could be further characterized with MRI if felt clinically indicated. - Splenomegaly. - Dilated common bile duct measuring up  to 12 mm without visible ductal stone or obstructing mass lesion. This could be further evaluated with MRCP. - Aortic atherosclerosis.   EKG: 08/09/19: Sinus rhythm with marked sinus arrhythmia Otherwise normal ECG Confirmed by Davonna Belling [id number]) on 08/10/2019 8:31:01 AM   CV: Requesting any cardiac studies from [facility].   Past Medical History:  Diagnosis Date  . Addiction ([place])   . Hepatitis C     Past Surgical History:  Procedure Laterality Date   . APPENDECTOMY     per pt 2018  . BACK SURGERY      MEDICATIONS: . Oxycodone HCl 10 MG TABS   No current facility-administered medications for this encounter.    Myra Gianotti, PA-C Surgical Short Stay/Anesthesiology [facility] Phone [phone] [facility] Phone [phone] 12/22/2019 5:24 PM

## 2019-12-26 ENCOUNTER — Other Ambulatory Visit (HOSPITAL_COMMUNITY): Payer: Medicare Other

## 2019-12-29 ENCOUNTER — Ambulatory Visit (HOSPITAL_COMMUNITY): Admission: RE | Admit: 2019-12-29 | Payer: Medicare Other | Source: Home / Self Care | Admitting: Orthopedic Surgery

## 2019-12-29 ENCOUNTER — Encounter (HOSPITAL_COMMUNITY): Admission: RE | Payer: Self-pay | Source: Home / Self Care

## 2019-12-29 SURGERY — POSTERIOR CERVICAL FUSION/FORAMINOTOMY LEVEL 4
Anesthesia: General

## 2020-01-19 ENCOUNTER — Ambulatory Visit: Payer: Medicare Other | Admitting: Rheumatology

## 2020-05-22 ENCOUNTER — Emergency Department
Admission: EM | Admit: 2020-05-22 | Discharge: 2020-05-23 | Disposition: A | Discharge status: Other Acute Inpatient Hospital | Payer: Medicare Other | Attending: Emergency Medicine | Admitting: Emergency Medicine

## 2020-05-22 ENCOUNTER — Other Ambulatory Visit: Payer: Self-pay

## 2020-05-22 ENCOUNTER — Encounter: Payer: Self-pay | Admitting: Emergency Medicine

## 2020-05-22 DIAGNOSIS — F4310 Post-traumatic stress disorder, unspecified: Secondary | ICD-10-CM | POA: Diagnosis not present

## 2020-05-22 DIAGNOSIS — F332 Major depressive disorder, recurrent severe without psychotic features: Secondary | ICD-10-CM | POA: Diagnosis not present

## 2020-05-22 DIAGNOSIS — R45851 Suicidal ideations: Secondary | ICD-10-CM | POA: Insufficient documentation

## 2020-05-22 DIAGNOSIS — F329 Major depressive disorder, single episode, unspecified: Secondary | ICD-10-CM | POA: Insufficient documentation

## 2020-05-22 DIAGNOSIS — F17210 Nicotine dependence, cigarettes, uncomplicated: Secondary | ICD-10-CM | POA: Diagnosis not present

## 2020-05-22 DIAGNOSIS — F1099 Alcohol use, unspecified with unspecified alcohol-induced disorder: Secondary | ICD-10-CM | POA: Diagnosis not present

## 2020-05-22 DIAGNOSIS — F1190 Opioid use, unspecified, uncomplicated: Secondary | ICD-10-CM | POA: Insufficient documentation

## 2020-05-22 DIAGNOSIS — Z20822 Contact with and (suspected) exposure to covid-19: Secondary | ICD-10-CM | POA: Insufficient documentation

## 2020-05-22 LAB — CBC
HCT: 44.5 % (ref 39.0–52.0)
Hemoglobin: 14.8 g/dL (ref 13.0–17.0)
MCH: 28.9 pg (ref 26.0–34.0)
MCHC: 33.3 g/dL (ref 30.0–36.0)
MCV: 86.9 fL (ref 80.0–100.0)
Platelets: 134 10*3/uL — ABNORMAL LOW (ref 150–400)
RBC: 5.12 MIL/uL (ref 4.22–5.81)
RDW: 13.1 % (ref 11.5–15.5)
WBC: 9.7 10*3/uL (ref 4.0–10.5)
nRBC: 0 % (ref 0.0–0.2)

## 2020-05-22 LAB — URINE DRUG SCREEN, QUALITATIVE (ARMC ONLY)
Amphetamines, Ur Screen: NOT DETECTED
Barbiturates, Ur Screen: NOT DETECTED
Benzodiazepine, Ur Scrn: POSITIVE — AB
Cannabinoid 50 Ng, Ur ~~LOC~~: NOT DETECTED
Cocaine Metabolite,Ur ~~LOC~~: NOT DETECTED
MDMA (Ecstasy)Ur Screen: NOT DETECTED
Methadone Scn, Ur: NOT DETECTED
Opiate, Ur Screen: NOT DETECTED
Phencyclidine (PCP) Ur S: NOT DETECTED
Tricyclic, Ur Screen: NOT DETECTED

## 2020-05-22 LAB — COMPREHENSIVE METABOLIC PANEL
ALT: 19 U/L (ref 0–44)
AST: 26 U/L (ref 15–41)
Albumin: 4.2 g/dL (ref 3.5–5.0)
Alkaline Phosphatase: 60 U/L (ref 38–126)
Anion gap: 12 (ref 5–15)
BUN: 15 mg/dL (ref 6–20)
CO2: 28 mmol/L (ref 22–32)
Calcium: 9.5 mg/dL (ref 8.9–10.3)
Chloride: 100 mmol/L (ref 98–111)
Creatinine, Ser: 0.82 mg/dL (ref 0.61–1.24)
GFR, Estimated: >60 mL/min (ref >60)
Glucose, Bld: 92 mg/dL (ref 70–99)
Potassium: 5.1 mmol/L (ref 3.5–5.1)
Sodium: 140 mmol/L (ref 135–145)
Total Bilirubin: 0.8 mg/dL (ref 0.3–1.2)
Total Protein: 8.1 g/dL (ref 6.5–8.1)

## 2020-05-22 LAB — ETHANOL: Alcohol, Ethyl (B): <10 mg/dL (ref <10)

## 2020-05-22 LAB — SALICYLATE LEVEL: Salicylate Lvl: <7 mg/dL — ABNORMAL LOW (ref 7.0–30.0)

## 2020-05-22 LAB — ACETAMINOPHEN LEVEL: Acetaminophen (Tylenol), Serum: <10 ug/mL — ABNORMAL LOW (ref 10–30)

## 2020-05-22 NOTE — ED Notes (Signed)
Hourly rounding reveals patient in room. No complaints, stable, in no acute distress. Q15 minute rounds and monitoring via Rover and Officer to continue.   

## 2020-05-22 NOTE — ED Notes (Addendum)
Pt belongings include:   1 black wallet  1 black knife( locked into safe with label by security) 1 box cigarettes  1 blue shirt 1 green short 2 black shoes 2 black socks 1 brown belt  Pt also has 1 black back pack, 1 gray back pack and a clear trash bag with articles of clothing. Pt in total has 4 bags labeled.

## 2020-05-22 NOTE — ED Triage Notes (Signed)
Patient to ER from [place] via [employer] with IVC papers for c/o threats of SI (held knife to throat). Patient has h/o suicide attempt 3 years ago by overdosing on pills. Patient has h/o major depressive disorder, PTSD, and heroin/ETOH abuse. Patient reports h/o seizures with attempting to stop. Patient also has h/o cirrhosis, Hep C, nose CA, and chronic back pain.

## 2020-05-22 NOTE — ED Notes (Signed)
Pt. Transferred from Triage to room 21 after dressing out and screening for contraband.Pt. Oriented to [employer] including Q15 minute rounds as well as Engineer, drilling for their protection. Patient is alert and oriented, warm and dry in no acute distress. Patient reported SI and depression. Denied HI, and AVH. Pt. Encouraged to let me know if needs arise.

## 2020-05-22 NOTE — ED Provider Notes (Signed)
[facility] Emergency Department Provider Note  ____________________________________________   I have reviewed the triage vital signs and the nursing notes.   HISTORY  Chief Complaint Depression  History limited by: Not Limited   HPI Alex Jacobs is a 54 y.o. male who presents to the emergency department today from [place] under IVC because of concerns for depression and suicidal ideation.  Patient states he has a history of PTSD depression and previous suicide attempts.  The patient says that anytime he goes to sleep he feels like he sees the images of his dead family.  He currently is homeless and has been using and abusing alcohol and heroin.  Apparently today the patient got a knife and was threatening to harm himself. Patient's only complaint is for chronic pain.   Records reviewed. Per medical record review patient has a history of PTSD, SI.  Past Medical History:  Diagnosis Date  . Addiction ([place])   . Hepatitis C     Patient Active Problem List   Diagnosis Date Noted  . MDD (major depressive disorder), recurrent episode, moderate ([place]) 05/30/2019  . PTSD (post-traumatic stress disorder) 05/30/2019  . Insomnia 05/30/2019  . Suicidal ideation 05/28/2019  . Alcohol withdrawal ([place]) 05/27/2019    Past Surgical History:  Procedure Laterality Date  . APPENDECTOMY     per pt 2018  . BACK SURGERY      Prior to Admission medications   Medication Sig Start Date End Date Taking? Authorizing Provider  Oxycodone HCl 10 MG TABS Take 10 mg by mouth every 4 (four) hours as needed (pain).    [provider]    Allergies Toradol [ketorolac tromethamine]  Family History  Problem Relation Age of Onset  . Cancer Mother   . Cancer Father     Social History Social History   Tobacco Use  . Smoking status: Current Every Day Smoker    Packs/day: 0.50    Types: Cigarettes  . Smokeless tobacco: Never Used  Vaping Use  . Vaping Use: Never used   Substance Use Topics  . Alcohol use: Yes    Comment: daily use  a fifth a day  . Drug use: Yes    Types: Methamphetamines, IV    Comment: heroin, fentanyl    Review of Systems Constitutional: No fever/chills Eyes: No visual changes. ENT: No sore throat. Cardiovascular: Denies chest pain. Respiratory: Denies shortness of breath. Gastrointestinal: No abdominal pain.  No nausea, no vomiting.  No diarrhea.   Genitourinary: Negative for dysuria. Musculoskeletal: Positive for neck pain. Skin: Negative for rash. Neurological: Negative for headaches, focal weakness or numbness.  ____________________________________________   PHYSICAL EXAM:  VITAL SIGNS: ED Triage Vitals [05/22/20 1933]  Enc Vitals Group     BP 115/76     Pulse Rate 75     Resp 17     Temp 98.7 F (37.1 C)     Temp Source Oral     SpO2 97 %     Weight    Constitutional: Alert and oriented.  Eyes: Conjunctivae are normal.  ENT      Head: Normocephalic and atraumatic.      Nose: No congestion/rhinnorhea.      Mouth/Throat: Mucous membranes are moist.      Neck: No stridor. Hematological/Lymphatic/Immunilogical: No cervical lymphadenopathy. Cardiovascular: Normal rate, regular rhythm.  No murmurs, rubs, or gallops. Respiratory: Normal respiratory effort without tachypnea nor retractions. Breath sounds are clear and equal bilaterally. No wheezes/rales/rhonchi. Gastrointestinal: Soft and non tender.  No rebound. No guarding.  Genitourinary: Deferred Musculoskeletal: Normal range of motion in all extremities. No lower extremity edema. Neurologic:  Normal speech and language. No gross focal neurologic deficits are appreciated.  Skin:  Skin is warm, dry and intact. No rash noted. Psychiatric: Depressed, endorses SI.  ____________________________________________    LABS (pertinent positives/negatives)  Acetaminophen, salicylate, ethanol below threshold CMP wnl CBC wbc 9.7, hgb 14.8, plt 134 UDS positive  for benzo ____________________________________________   EKG  None  ____________________________________________    RADIOLOGY  None  ____________________________________________   PROCEDURES  Procedures  ____________________________________________   INITIAL IMPRESSION / ASSESSMENT AND PLAN / ED COURSE  Pertinent labs & imaging results that were available during my care of the patient were reviewed by me and considered in my medical decision making (see chart for details).   Patient presented to the emergency department today because of concerns for suicidal ideation.  Patient did present under IVC.  Talk to the patient he does endorse suicidal ideation and depression.  Will continue IVC.  Will have our psychiatric team evaluate.  The patient has been placed in psychiatric observation due to the need to provide a safe environment for the patient while obtaining psychiatric consultation and evaluation, as well as ongoing medical and medication management to treat the patient's condition.  The patient has been placed under full IVC at this time.   ____________________________________________   FINAL CLINICAL IMPRESSION(S) / ED DIAGNOSES  Final diagnoses:  Suicidal ideation     Note: This dictation was prepared with Dragon dictation. Any transcriptional errors that result from this process are unintentional     Nance Pear, MD 05/22/20 2309

## 2020-05-22 NOTE — ED Triage Notes (Signed)
Pt arrived via [employer] from [place] under IVC. Per pt and [facility] paperwork, pt was transported to [place] [facility] to [place] and today had a knife that he was having active thoughts of cutting self to commit suicide. Pt uses heroin and drinks alcohol dailey. Pt has attempted suicide in past with OD. Pt reports he has a hx/o PTSD and depression but is not currently or has ever sought help or medication for such. Pt reports he lives alone and does not have family or support by anyone. Pt is calm and cooperative in triage. Pt denies HI but is having SI.

## 2020-05-23 DIAGNOSIS — F329 Major depressive disorder, single episode, unspecified: Secondary | ICD-10-CM | POA: Diagnosis not present

## 2020-05-23 LAB — RESPIRATORY PANEL BY RT PCR (FLU A&B, COVID)
Influenza A by PCR: NEGATIVE
Influenza B by PCR: NEGATIVE
SARS Coronavirus 2 by RT PCR: NEGATIVE

## 2020-05-23 NOTE — BH Assessment (Signed)
PATIENT BED AVAILABLE AFTER 10AM  Patient has been accepted to [facility].  Patient assigned to room 503-B Accepting physician is Dr. Marylou Flesher.  Call report to [phone].  Representative was [employer].   ER Staff is aware of it:  [facility] ER Secretary  Dr. Owens Shark, ER MD  [facility] Patient's Nurse     Address: [address] [place], [place] [address]

## 2020-05-23 NOTE — BH Assessment (Signed)
Assessment Note  Alex Jacobs is an 54 y.o. male presenting to [facility] ED under IVC given by [facility]. Per triage note Patient to ER from [place] via BPD with IVC papers for c/o threats of SI (held knife to throat). Patient has h/o suicide attempt 3 years ago by overdosing on pills. Patient has h/o major depressive disorder, PTSD, and heroin/ETOH abuse. During assessment patient appears alert and oriented x4, cooperative and appears depressed. Patient reported "I been drinking pretty heavily and using heroin." Patient reported his last drank was 05/22/20 and last use of heroin was 05/21/20. Patient reports that he has been drinking daily "2 16 oz beers" and using heroin daily "1/2 gram" via injection. Patient reports continued SI with no current plan and is aware of is attempt to try and hurt himself today with a knife. Patient reports that he has been having difficulty sleeping due to his PTSD. Patient reports having nightmares due to witnessing his mother and father pass away and having "to pull the plug on brother that was on life support." Patient reports he has attempted to hurt himself "3 years ago I died in [place] but they brought me back to life, I tried to overdose." Patient reports since that hospitalization patient has not been on medications to manage his depression, anxiety, and PTSD. Patient also reports currently being homeless for the last 4 months, he reports that is when the heroin use started. Patient UDS positive for Benzos, no BAL detected. Patient continues to report SI denies HI/AH/VH and does not appear to be responding to any internal or external stimuli.   Per Psyc NP Lindon Romp patient is recommended for Inpatient Hospitalization  Diagnosis: Major Depressive Disorder, severe. Opioid Use, Alcohol Use, Hx of PTSD  Past Medical History:  Past Medical History:  Diagnosis Date  . Addiction ([place])   . Hepatitis C     Past Surgical History:  Procedure Laterality Date  . APPENDECTOMY      per pt 2018  . BACK SURGERY      Family History:  Family History  Problem Relation Age of Onset  . Cancer Mother   . Cancer Father     Social History:  reports that he has been smoking cigarettes. He has been smoking about 0.50 packs per day. He has never used smokeless tobacco. He reports current alcohol use. He reports current drug use. Drugs: Methamphetamines and IV.  Additional Social History:  Alcohol / Drug Use Pain Medications: See MAR Prescriptions: See MAR Over the Counter: See MAR History of alcohol / drug use?: Yes Substance #1 Name of Substance 1: Alcohol Substance #2 Name of Substance 2: Heroin  CIWA: CIWA-Ar BP: 115/76 Pulse Rate: 75 COWS:    Allergies:  Allergies  Allergen Reactions  . Toradol [Ketorolac Tromethamine] Anxiety    Home Medications: (Not in a hospital admission)   OB/GYN Status:  No LMP for male patient.  General Assessment Data Location of Assessment: [facility] ED TTS Assessment: In system Is this a Tele or Face-to-Face Assessment?: Face-to-Face Is this an Initial Assessment or a Re-assessment for this encounter?: Initial Assessment Patient Accompanied by:: N/A Language Other than English: No Living Arrangements: Homeless/Shelter What gender do you identify as?: Male Marital status: Single Pregnancy Status: No Living Arrangements: Other (Comment) (Homeless) Can pt return to current living arrangement?: Yes Admission Status: Involuntary Petitioner: Other Is patient capable of signing voluntary admission?: No Referral Source: Other Insurance type: Medicare  Medical Screening Exam ([place]) Medical Exam  completed: Yes  Crisis Care Plan Living Arrangements: Other (Comment) (Homeless) Legal Guardian: Other: (Self) Name of Psychiatrist: None Name of Therapist: None  Education Status Is patient currently in school?: No Is the patient employed, unemployed or receiving disability?: Receiving disability income  Risk to  self with the past 6 months Suicidal Ideation: Yes-Currently Present Has patient been a risk to self within the past 6 months prior to admission? : Yes Suicidal Intent: Yes-Currently Present Has patient had any suicidal intent within the past 6 months prior to admission? : Yes Is patient at risk for suicide?: Yes Suicidal Plan?: No-Not Currently/Within Last 6 Months Has patient had any suicidal plan within the past 6 months prior to admission? : Yes Access to Means: Yes Specify Access to Suicidal Means: Patient held a knife up to his throat What has been your use of drugs/alcohol within the last 12 months?: Alcohol, Heroin Previous Attempts/Gestures: Yes How many times?: 1 Other Self Harm Risks: None Triggers for Past Attempts: None known Intentional Self Injurious Behavior: None Family Suicide History: No Recent stressful life event(s): Other (Comment), Financial Problems (Currently homeless) Persecutory voices/beliefs?: No Depression: Yes Depression Symptoms: Isolating, Fatigue, Loss of interest in usual pleasures, Feeling worthless/self pity Substance abuse history and/or treatment for substance abuse?: Yes Suicide prevention information given to non-admitted patients: Not applicable  Risk to Others within the past 6 months Homicidal Ideation: No Does patient have any lifetime risk of violence toward others beyond the six months prior to admission? : No Thoughts of Harm to Others: No Current Homicidal Intent: No Current Homicidal Plan: No Access to Homicidal Means: No Identified Victim: None History of harm to others?: No Assessment of Violence: None Noted Violent Behavior Description: None Does patient have access to weapons?: No Criminal Charges Pending?: No Does patient have a court date: No Is patient on probation?: No  Psychosis Hallucinations: None noted Delusions: None noted  Mental Status Report Appearance/Hygiene: In scrubs Eye Contact: Fair Motor Activity:  Freedom of movement Speech: Logical/coherent Level of Consciousness: Alert Mood: Depressed, Sad Affect: Appropriate to circumstance Anxiety Level: None Thought Processes: Coherent Judgement: Unimpaired Orientation: Person, Place, Time, Situation, Appropriate for developmental age Obsessive Compulsive Thoughts/Behaviors: None  Cognitive Functioning Concentration: Normal Memory: Recent Intact, Remote Intact Is patient IDD: No Insight: Fair Impulse Control: Fair Appetite: Poor Have you had any weight changes? : No Change Sleep: Decreased Total Hours of Sleep: 0 Vegetative Symptoms: None  ADLScreening [facility] Assessment Services) Patient's cognitive ability adequate to safely complete daily activities?: Yes Patient able to express need for assistance with ADLs?: Yes Independently performs ADLs?: Yes (appropriate for developmental age)  Prior Inpatient Therapy Prior Inpatient Therapy: Yes Prior Therapy Dates: 2018 Prior Therapy Facilty/Provider(s): [facility] Reason for Treatment: Attempted overdose  Prior Outpatient Therapy Prior Outpatient Therapy: No Does patient have an ACCT team?: No Does patient have Intensive In-House Services?  : No Does patient have [facility] services? : No Does patient have P4CC services?: No  ADL Screening (condition at time of admission) Patient's cognitive ability adequate to safely complete daily activities?: Yes Is the patient deaf or have difficulty hearing?: No Does the patient have difficulty seeing, even when wearing glasses/contacts?: No Does the patient have difficulty concentrating, remembering, or making decisions?: No Patient able to express need for assistance with ADLs?: Yes Does the patient have difficulty dressing or bathing?: No Independently performs ADLs?: Yes (appropriate for developmental age) Does the patient have difficulty walking or climbing stairs?: No Weakness of Legs: None  Weakness of Arms/Hands: None  Home  Assistive Devices/Equipment Home Assistive Devices/Equipment: None  Therapy Consults (therapy consults require a physician order) PT Evaluation Needed: No OT Evalulation Needed: No SLP Evaluation Needed: No Abuse/Neglect Assessment (Assessment to be complete while patient is alone) Abuse/Neglect Assessment Can Be Completed: Yes Physical Abuse: Denies Verbal Abuse: Denies Sexual Abuse: Denies Exploitation of patient/patient's resources: Denies Self-Neglect: Denies Values / Beliefs Cultural Requests During Hospitalization: None Spiritual Requests During Hospitalization: None Consults Spiritual Care Consult Needed: No Transition of Care Team Consult Needed: No            Disposition: Per Psyc NP Lindon Romp patient is recommended for Inpatient Hospitalization Disposition Initial Assessment Completed for this Encounter: Yes  On Site Evaluation by:   Reviewed with Physician:    Leonie Douglas MS LCASA 05/23/2020 1:15 AM

## 2020-05-23 NOTE — ED Notes (Signed)
Hourly rounding reveals patient in room. No complaints, stable, in no acute distress. Q15 minute rounds and monitoring via Rover and Officer to continue.   

## 2020-05-23 NOTE — ED Notes (Signed)
Report called and given to receiving nurse. Aware patient is on his way

## 2020-05-23 NOTE — BH Assessment (Signed)
Referral information for Psychiatric Hospitalization faxed to;   [facility] [phone], [phone], [phone] or [phone]),   . [facility] [phone]),   . [facility] [phone] -or- [phone]),   . [facility] [place] [phone])

## 2020-09-16 IMAGING — MR MR HEAD W/O CM
11 of 12 series · 39 of 48 positions shown · IV contrast (Yes)
Comparison: Head CT 05/27/2019

CLINICAL DATA: New onset seizure.  Alcohol and drug use.

EXAM:
MRI HEAD WITHOUT CONTRAST
TECHNIQUE: Multiplanar, multiecho pulse sequences of the brain and surrounding
structures were obtained without intravenous contrast.

[Series 3: T1 · sagittal · 5.0mm · 0.47mm/px · 1 of 20 slices shown (1 of 2)]
[im 1/20]
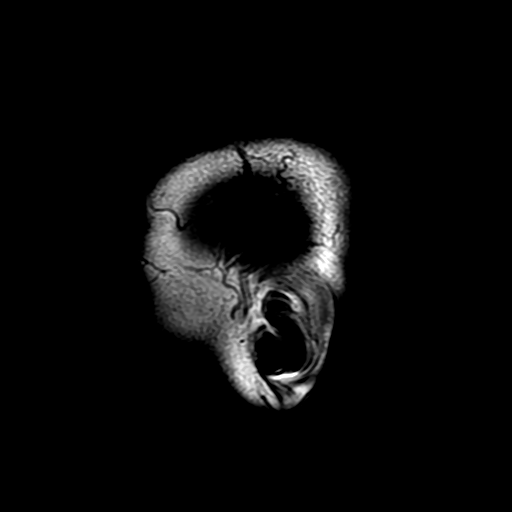

[Series 4: DWI · axial · 3.0mm · 1.09mm/px · z∈[-68,+72]mm · 8 of 96 slices shown (1 of 4)]
[im 1/96]
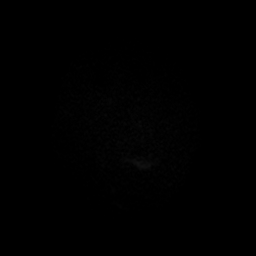
[im 14/96]
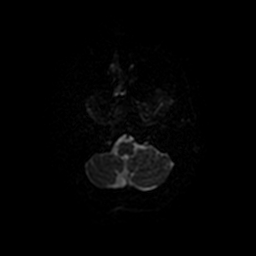
[im 28/96]
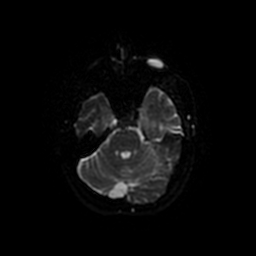
[im 41/96]
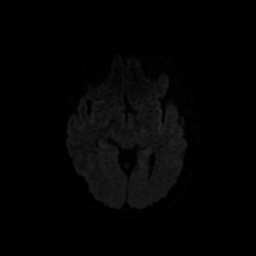
[im 55/96]
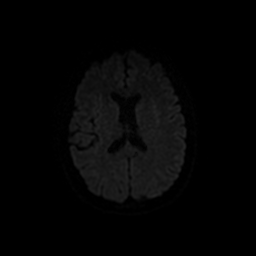
[im 68/96]
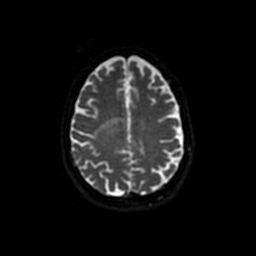
[im 82/96]
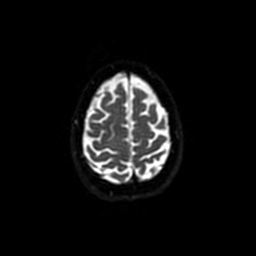
[im 96/96]
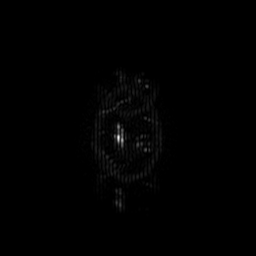

[Series 5: T2 · axial · 5.0mm · 0.86mm/px · z∈[-65,+74]mm · 2 of 21 slices shown (1 of 3)]
[im 1/21]
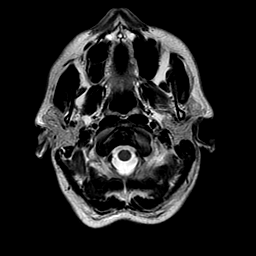
[im 21/21]
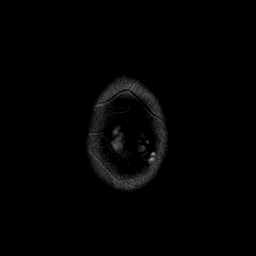

[Series 6: FLAIR · axial · 3.0mm · 0.43mm/px · z∈[-67,+76]mm · 2 of 25 slices shown (1 of 2)]
[im 1/25]
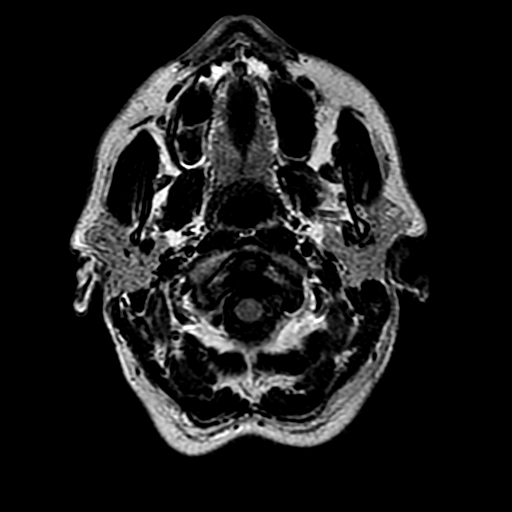
[im 25/25]
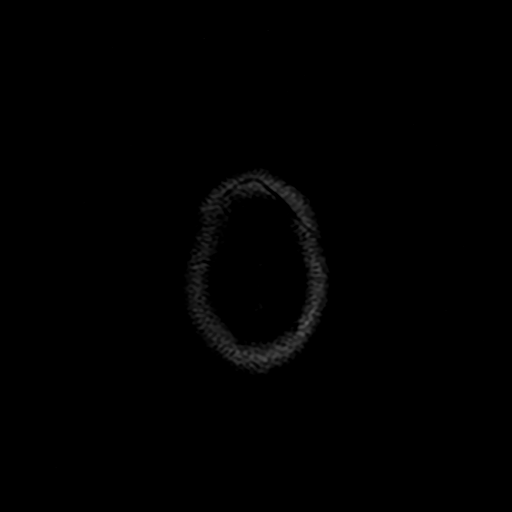

[Series 8: T1 · axial · 1.0mm · 0.47mm/px · z∈[-66,-11]mm · 4 of 140 slices shown (2 of 2)]
[im 1/140]
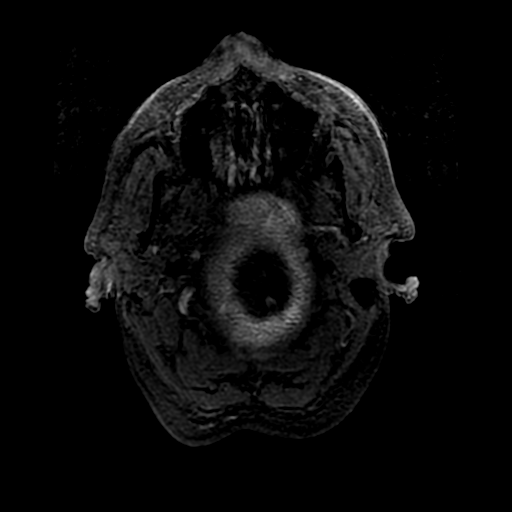
[im 28/140]
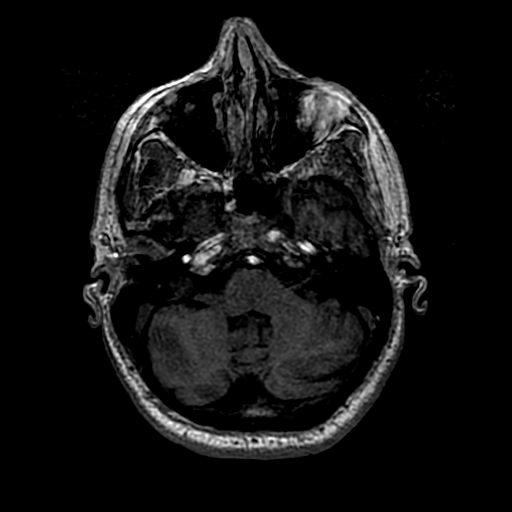
[im 42/140]
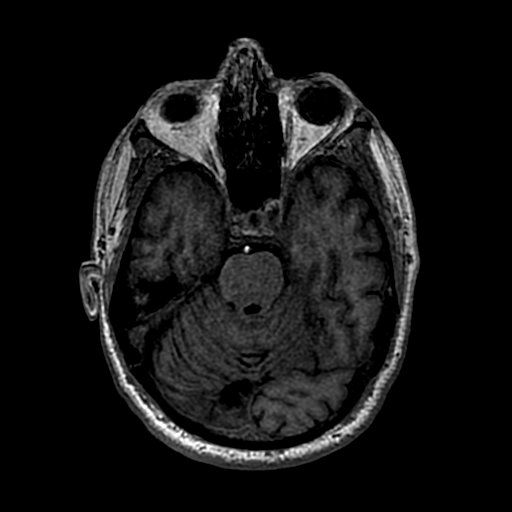
[im 56/140]
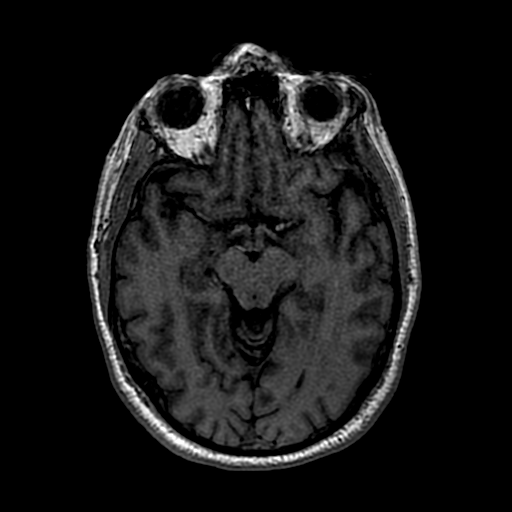

[Series 9: DWI · coronal · 4.0mm · 1.09mm/px · 7 of 86 slices shown (2 of 4)]
[im 1/86]
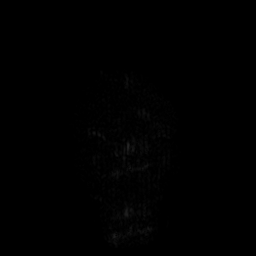
[im 15/86]
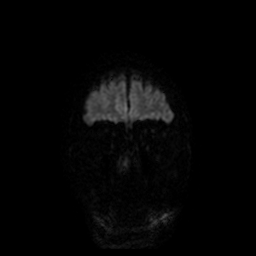
[im 29/86]
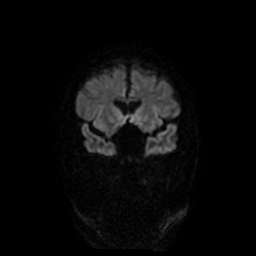
[im 43/86]
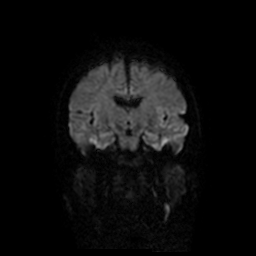
[im 57/86]
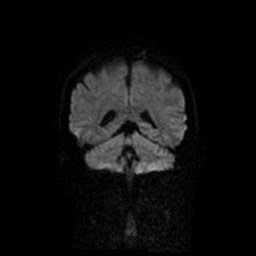
[im 71/86]
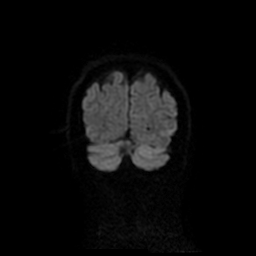
[im 86/86]
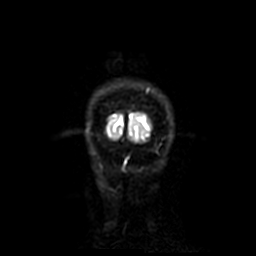

[Series 10: T2 · coronal · 5.0mm · 0.90mm/px · 2 of 27 slices shown (2 of 3)]
[im 1/27]
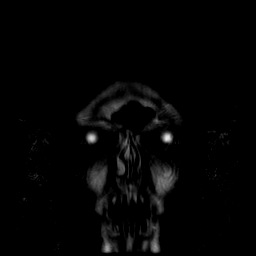
[im 27/27]
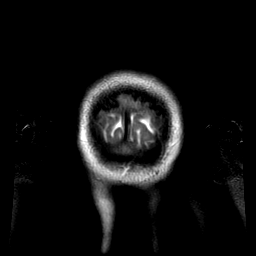

[Series 11: T2 · coronal · 3.0mm · 0.70mm/px · 3 of 35 slices shown (3 of 3)]
[im 1/35]
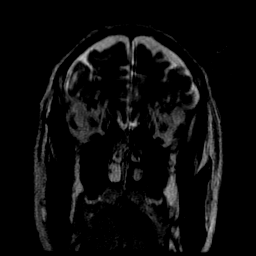
[im 18/35]
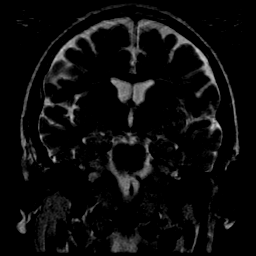
[im 35/35]
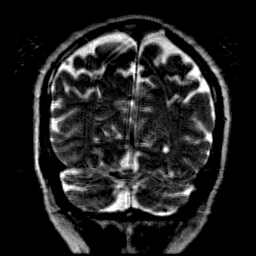

[Series 12: FLAIR · coronal · 3.0mm · 0.35mm/px · 3 of 35 slices shown (2 of 2)]
[im 1/35]
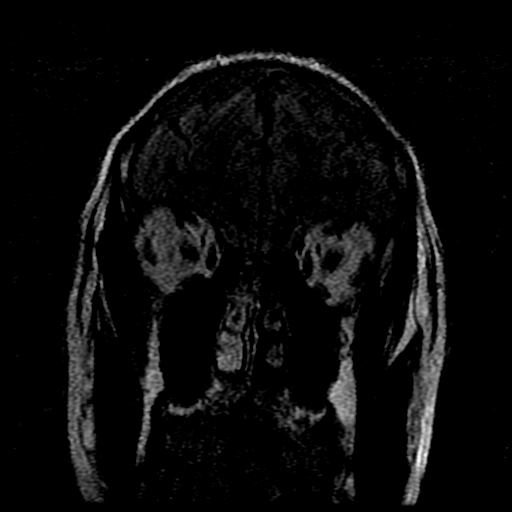
[im 18/35]
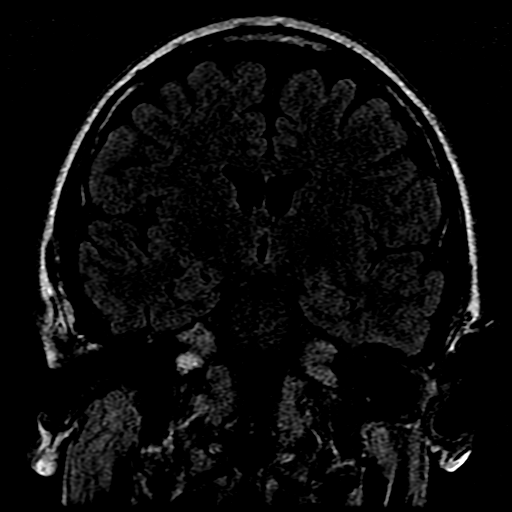
[im 35/35]
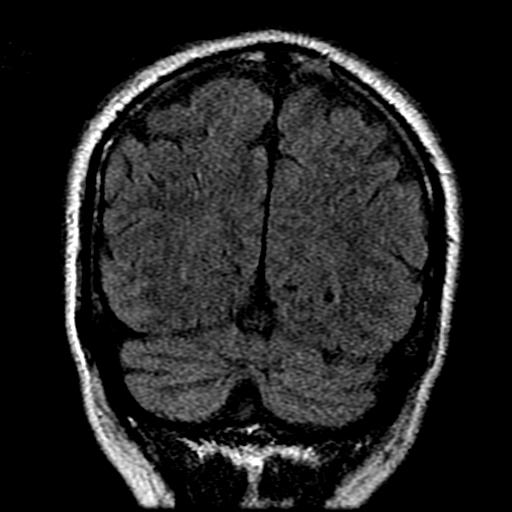

[Series 400: DWI · axial · 3.0mm · 1.09mm/px · z∈[-68,+72]mm · 4 of 48 slices shown (3 of 4)]
[im 1/48]
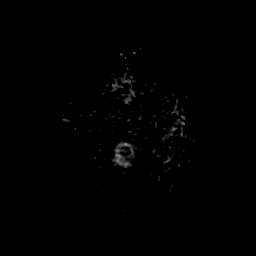
[im 16/48]
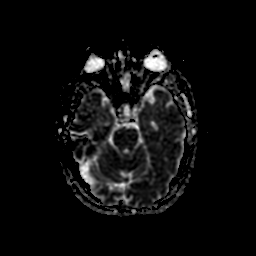
[im 32/48]
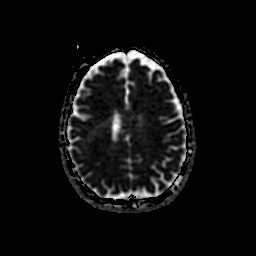
[im 48/48]
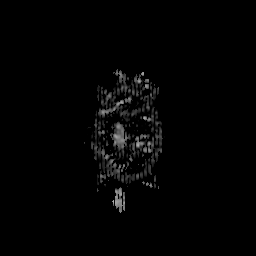

[Series 900: DWI · coronal · 4.0mm · 1.09mm/px · 3 of 43 slices shown (4 of 4)]
[im 1/43]
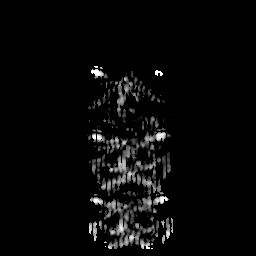
[im 22/43]
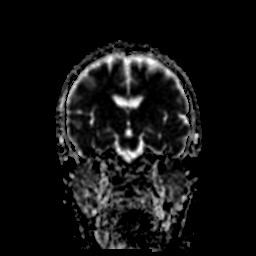
[im 43/43]
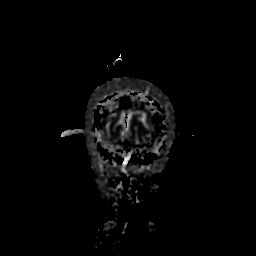

[39 of 48 positions shown; findings below may reference images not displayed]

FINDINGS: Brain: The brain has a normal appearance without evidence of
malformation, atrophy, old or acute small or large vessel
infarction, mass lesion, hemorrhage, hydrocephalus or extra-axial
collection. No mesial temporal lesion is seen.

Vascular: Major vessels at the base of the brain show flow. Venous
sinuses appear patent.

Skull and upper cervical spine: Normal.

Sinuses/Orbits: Clear/normal.

Other: None significant.
IMPRESSION: Within normal limits. No acute or reversible finding. No specific
abnormality seen to explain seizure.

## 2021-05-17 ENCOUNTER — Encounter (HOSPITAL_COMMUNITY): Payer: Self-pay | Admitting: Emergency Medicine

## 2021-05-17 ENCOUNTER — Emergency Department (HOSPITAL_COMMUNITY)
Admission: EM | Admit: 2021-05-17 | Discharge: 2021-05-18 | Disposition: A | Discharge status: Home / Self Care | Payer: Medicare Other | Attending: Emergency Medicine | Admitting: Emergency Medicine

## 2021-05-17 DIAGNOSIS — Y906 Blood alcohol level of 120-199 mg/100 ml: Secondary | ICD-10-CM | POA: Diagnosis not present

## 2021-05-17 DIAGNOSIS — R739 Hyperglycemia, unspecified: Secondary | ICD-10-CM | POA: Insufficient documentation

## 2021-05-17 DIAGNOSIS — F1020 Alcohol dependence, uncomplicated: Secondary | ICD-10-CM | POA: Insufficient documentation

## 2021-05-17 DIAGNOSIS — F17210 Nicotine dependence, cigarettes, uncomplicated: Secondary | ICD-10-CM | POA: Diagnosis not present

## 2021-05-17 LAB — CBC WITH DIFFERENTIAL/PLATELET
Abs Immature Granulocytes: 0.05 10*3/uL (ref 0.00–0.07)
Basophils Absolute: 0.1 10*3/uL (ref 0.0–0.1)
Basophils Relative: 1 %
Eosinophils Absolute: 0.5 10*3/uL (ref 0.0–0.5)
Eosinophils Relative: 5 %
HCT: 47.1 % (ref 39.0–52.0)
Hemoglobin: 16 g/dL (ref 13.0–17.0)
Immature Granulocytes: 1 %
Lymphocytes Relative: 30 %
Lymphs Abs: 3.1 10*3/uL (ref 0.7–4.0)
MCH: 30.2 pg (ref 26.0–34.0)
MCHC: 34 g/dL (ref 30.0–36.0)
MCV: 89 fL (ref 80.0–100.0)
Monocytes Absolute: 0.6 10*3/uL (ref 0.1–1.0)
Monocytes Relative: 6 %
Neutro Abs: 6 10*3/uL (ref 1.7–7.7)
Neutrophils Relative %: 57 %
Platelets: 163 10*3/uL (ref 150–400)
RBC: 5.29 MIL/uL (ref 4.22–5.81)
RDW: 13.4 % (ref 11.5–15.5)
WBC: 10.4 10*3/uL (ref 4.0–10.5)
nRBC: 0 % (ref 0.0–0.2)

## 2021-05-17 LAB — COMPREHENSIVE METABOLIC PANEL
ALT: 23 U/L (ref 0–44)
AST: 23 U/L (ref 15–41)
Albumin: 4.4 g/dL (ref 3.5–5.0)
Alkaline Phosphatase: 59 U/L (ref 38–126)
Anion gap: 11 (ref 5–15)
BUN: 15 mg/dL (ref 6–20)
CO2: 24 mmol/L (ref 22–32)
Calcium: 9.4 mg/dL (ref 8.9–10.3)
Chloride: 105 mmol/L (ref 98–111)
Creatinine, Ser: 0.84 mg/dL (ref 0.61–1.24)
GFR, Estimated: >60 mL/min (ref >60)
Glucose, Bld: 108 mg/dL — ABNORMAL HIGH (ref 70–99)
Potassium: 4 mmol/L (ref 3.5–5.1)
Sodium: 140 mmol/L (ref 135–145)
Total Bilirubin: 0.5 mg/dL (ref 0.3–1.2)
Total Protein: 7.7 g/dL (ref 6.5–8.1)

## 2021-05-17 LAB — CBG MONITORING, ED
Glucose-Capillary: 117 mg/dL — ABNORMAL HIGH (ref 70–99)
Glucose-Capillary: 90 mg/dL (ref 70–99)

## 2021-05-17 LAB — TROPONIN I (HIGH SENSITIVITY): Troponin I (High Sensitivity): 3 ng/L (ref <18)

## 2021-05-17 LAB — RAPID URINE DRUG SCREEN, HOSP PERFORMED
Amphetamines: NOT DETECTED
Barbiturates: NOT DETECTED
Benzodiazepines: NOT DETECTED
Cocaine: NOT DETECTED
Opiates: POSITIVE — AB
Tetrahydrocannabinol: POSITIVE — AB

## 2021-05-17 LAB — ACETAMINOPHEN LEVEL: Acetaminophen (Tylenol), Serum: <10 ug/mL — ABNORMAL LOW (ref 10–30)

## 2021-05-17 LAB — SALICYLATE LEVEL: Salicylate Lvl: <7 mg/dL — ABNORMAL LOW (ref 7.0–30.0)

## 2021-05-17 LAB — LIPASE, BLOOD: Lipase: 35 U/L (ref 11–51)

## 2021-05-17 LAB — ETHANOL: Alcohol, Ethyl (B): 157 mg/dL — ABNORMAL HIGH (ref <10)

## 2021-05-17 MED ORDER — LORAZEPAM 2 MG/ML IJ SOLN: 0.0000 mg | Freq: Four times a day (QID) | INTRAMUSCULAR | Status: DC

## 2021-05-17 MED ORDER — LORAZEPAM 1 MG PO TABS
0.0000 mg | ORAL_TABLET | Freq: Two times a day (BID) | ORAL | Status: DC
Start: 2021-05-20 — End: 2021-05-18

## 2021-05-17 MED ORDER — LORAZEPAM 2 MG/ML IJ SOLN: 0.0000 mg | Freq: Two times a day (BID) | INTRAMUSCULAR | Status: DC

## 2021-05-17 MED ORDER — LORAZEPAM 1 MG PO TABS
0.0000 mg | ORAL_TABLET | Freq: Four times a day (QID) | ORAL | Status: DC
  Administered 2021-05-17: 1 mg via ORAL
  Filled 2021-05-17: qty 1

## 2021-05-17 MED ORDER — THIAMINE HCL 100 MG/ML IJ SOLN: 100.0000 mg | Freq: Every day | INTRAMUSCULAR | Status: DC

## 2021-05-17 MED ORDER — THIAMINE HCL 100 MG PO TABS
100.0000 mg | ORAL_TABLET | Freq: Every day | ORAL | Status: DC
  Administered 2021-05-17: 100 mg via ORAL
  Filled 2021-05-17: qty 1

## 2021-05-17 NOTE — ED Provider Notes (Signed)
Emergency Medicine Provider Triage Evaluation Note  Alex Jacobs , a 55 y.o. male  was evaluated in triage.  Pt complains of wanting to detox from alcohol. States that he has been drinking 1/5 of vodka a day for the past 3 or 4 years.  States that he "needs to be done with this."  Last drink was 4 PM.  States that he is also been taking "a bar of Xanax" a day for unknown amount of time.  Denies SI/HI/AVH.  He does endorse headache, tremors, diaphoresis, chest pain, nausea without vomiting. History of alcohol withdrawal seizures requiring admission Review of Systems  Positive: Tremor, headache Negative: Diarrhea, fever  Physical Exam  BP 132/82 (BP Location: Right Arm)   Pulse 95   Temp 98 F (36.7 C) (Oral)   Resp 18   Ht 5\' 8"  (1.727 m)   Wt 77.1 kg   SpO2 94%   BMI 25.85 kg/m  Gen:   Awake, no distress  Resp:  Normal effort, clear to auscultation bilaterally MSK:   Moves extremities without difficulty  Other:  Alert and oriented x4, neurologically intact.  He does have gross tremors on assessment.  No nystagmus present.  S1/S2 without murmurs rubs or gallops.  Abdomen soft.  Medical Decision Making  Medically screening exam initiated at 7:50 PM.  Appropriate orders placed.  Alex Jacobs was informed that the remainder of the evaluation will be completed by another provider, this initial triage assessment does not replace that evaluation, and the importance of remaining in the ED until their evaluation is complete.     Mickie Hillier, PA-C 05/17/21 [place], [place], DO 05/17/21 [id number]

## 2021-05-17 NOTE — ED Triage Notes (Signed)
Patient here from home brought in by fiance requesting detox from alcohol. States that last drink was 2 hours ago. "I need to get off this its been too long". Denies SI/HI/AVH.

## 2021-05-18 MED ORDER — CHLORDIAZEPOXIDE HCL 25 MG PO CAPS: ORAL_CAPSULE | ORAL | 0 refills | Status: AC

## 2021-05-18 NOTE — Discharge Instructions (Addendum)
Begin taking Librium taper as prescribed.  Follow-up with outpatient treatment facilities as per the resource guide.

## 2021-05-18 NOTE — ED Provider Notes (Signed)
[facility] DEPT Provider Note   CSN: [id number] Arrival date & time: 05/17/21  1903     History Chief Complaint  Patient presents with   detox   Alcohol Problem    Alex Jacobs is a 55 y.o. male.  Patient is a 55 year old male with past medical history of chronic alcoholism, depression, PTSD.  Patient presenting today requesting alcohol treatment.  He tells me he drinks 1/5 of liquor per day and has been doing this for many years.  He tells me that he is "done drinking" and is requesting help.  He tells me he called [place] addiction services and was told to come to the ER.  Last alcohol consumption was this morning.  He also admits to using Xanax that he purchases off the street to "help him sleep".  The history is provided by the patient.  Alcohol Problem This is a chronic problem. The problem occurs constantly. The problem has been gradually worsening. Nothing aggravates the symptoms. Nothing relieves the symptoms.      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[facility])    Hepatitis C     Patient Active Problem List   Diagnosis Date Noted   MDD (major depressive disorder), recurrent episode, moderate ([place]) 05/30/2019   PTSD (post-traumatic stress disorder) 05/30/2019   Insomnia 05/30/2019   Suicidal ideation 05/28/2019   Alcohol withdrawal ([place]) 05/27/2019    Past Surgical History:  Procedure Laterality Date   APPENDECTOMY     per pt 2018   BACK SURGERY         Family History  Problem Relation Age of Onset   Cancer Mother    Cancer Father     Social History   Tobacco Use   Smoking status: Every Day    Packs/day: 0.50    Types: Cigarettes   Smokeless tobacco: Never  Vaping Use   Vaping Use: Never used  Substance Use Topics   Alcohol use: Yes    Comment: daily use  a fifth a day   Drug use: Yes    Types: Methamphetamines, IV    Comment: heroin, fentanyl    Home Medications Prior to Admission medications    Medication Sig Start Date End Date Taking? Authorizing Provider  Oxycodone HCl 10 MG TABS Take 10 mg by mouth every 4 (four) hours as needed (pain).    [provider]    Allergies    Toradol [ketorolac tromethamine]  Review of Systems   Review of Systems  All other systems reviewed and are negative.  Physical Exam Updated Vital Signs BP 114/76   Pulse 67   Temp 98 F (36.7 C) (Oral)   Resp 15   Ht 5\' 8"  (1.727 m)   Wt 77.1 kg   SpO2 91%   BMI 25.85 kg/m   Physical Exam Vitals and nursing note reviewed.  Constitutional:      General: He is not in acute distress.    Appearance: He is well-developed. He is not diaphoretic.  HENT:     Head: Normocephalic and atraumatic.  Cardiovascular:     Rate and Rhythm: Normal rate and regular rhythm.     Heart sounds: No murmur heard.   No friction rub.  Pulmonary:     Effort: Pulmonary effort is normal. No respiratory distress.     Breath sounds: Normal breath sounds. No wheezing or rales.  Abdominal:     General: Bowel sounds are normal. There is no distension.  Palpations: Abdomen is soft.     Tenderness: There is no abdominal tenderness.  Musculoskeletal:        General: Normal range of motion.     Cervical back: Normal range of motion and neck supple.  Skin:    General: Skin is warm and dry.  Neurological:     Mental Status: He is alert and oriented to person, place, and time.     Coordination: Coordination normal.    ED Results / Procedures / Treatments   Labs (all labs ordered are listed, but only abnormal results are displayed) Labs Reviewed  COMPREHENSIVE METABOLIC PANEL - Abnormal; Notable for the following components:      Result Value   Glucose, Bld 108 (*)    All other components within normal limits  ETHANOL - Abnormal; Notable for the following components:   Alcohol, Ethyl (B) 157 (*)    All other components within normal limits  SALICYLATE LEVEL - Abnormal; Notable for the following  components:   Salicylate Lvl <2.1 (*)    All other components within normal limits  ACETAMINOPHEN LEVEL - Abnormal; Notable for the following components:   Acetaminophen (Tylenol), Serum <10 (*)    All other components within normal limits  RAPID URINE DRUG SCREEN, HOSP PERFORMED - Abnormal; Notable for the following components:   Opiates POSITIVE (*)    Tetrahydrocannabinol POSITIVE (*)    All other components within normal limits  CBG MONITORING, ED - Abnormal; Notable for the following components:   Glucose-Capillary 117 (*)    All other components within normal limits  LIPASE, BLOOD  CBC WITH DIFFERENTIAL/PLATELET  CBG MONITORING, ED  TROPONIN I (HIGH SENSITIVITY)  TROPONIN I (HIGH SENSITIVITY)    EKG None  Radiology No results found.  Procedures Procedures   Medications Ordered in ED Medications  LORazepam (ATIVAN) injection 0-4 mg ( Intravenous See Alternative 05/17/21 2220)    Or  LORazepam (ATIVAN) tablet 0-4 mg (1 mg Oral Given 05/17/21 2220)  LORazepam (ATIVAN) injection 0-4 mg (has no administration in time range)    Or  LORazepam (ATIVAN) tablet 0-4 mg (has no administration in time range)  thiamine tablet 100 mg (100 mg Oral Given 05/17/21 2220)    Or  thiamine (B-1) injection 100 mg ( Intravenous See Alternative 05/17/21 2220)    ED Course  I have reviewed the triage vital signs and the nursing notes.  Pertinent labs & imaging results that were available during my care of the patient were reviewed by me and considered in my medical decision making (see chart for details).    MDM Rules/Calculators/A&P  Patient presenting here requesting detox from alcohol.  Patient is here with stable vital signs, no tremor or overt signs of withdrawal.  Laboratory studies are unremarkable, but blood alcohol level is 157 and talk screen positive for opiates and marijuana.  Patient seems appropriate for discharge with outpatient follow-up.  He will be prescribed a Librium  taper and given the resource guide for alcohol treatment centers.  I will also place a referral to peer support who will follow up with him in the next few days as this is Friday night.  Final Clinical Impression(s) / ED Diagnoses Final diagnoses:  None    Rx / DC Orders ED Discharge Orders     None        Veryl Speak, MD 05/18/21 [phone]

## 2021-05-25 ENCOUNTER — Emergency Department (HOSPITAL_COMMUNITY)
Admission: EM | Admit: 2021-05-25 | Discharge: 2021-05-26 | Disposition: A | Discharge status: Home / Self Care | Payer: Medicare Other | Attending: Emergency Medicine | Admitting: Emergency Medicine

## 2021-05-25 ENCOUNTER — Other Ambulatory Visit: Payer: Self-pay

## 2021-05-25 DIAGNOSIS — R509 Fever, unspecified: Secondary | ICD-10-CM | POA: Insufficient documentation

## 2021-05-25 DIAGNOSIS — R079 Chest pain, unspecified: Secondary | ICD-10-CM | POA: Diagnosis not present

## 2021-05-25 DIAGNOSIS — R0602 Shortness of breath: Secondary | ICD-10-CM | POA: Diagnosis not present

## 2021-05-25 DIAGNOSIS — Z20822 Contact with and (suspected) exposure to covid-19: Secondary | ICD-10-CM | POA: Diagnosis not present

## 2021-05-25 DIAGNOSIS — M79604 Pain in right leg: Secondary | ICD-10-CM | POA: Insufficient documentation

## 2021-05-25 DIAGNOSIS — M25561 Pain in right knee: Secondary | ICD-10-CM | POA: Diagnosis present

## 2021-05-25 DIAGNOSIS — R112 Nausea with vomiting, unspecified: Secondary | ICD-10-CM | POA: Diagnosis not present

## 2021-05-25 DIAGNOSIS — D72829 Elevated white blood cell count, unspecified: Secondary | ICD-10-CM | POA: Diagnosis not present

## 2021-05-25 DIAGNOSIS — R110 Nausea: Secondary | ICD-10-CM | POA: Diagnosis not present

## 2021-05-25 DIAGNOSIS — F17210 Nicotine dependence, cigarettes, uncomplicated: Secondary | ICD-10-CM | POA: Diagnosis not present

## 2021-05-25 NOTE — ED Triage Notes (Signed)
Pt presents with sudden onset right leg pain and spontaneous bruising that began tonight. Reports fever of 102 at home.  No known injury

## 2021-05-26 ENCOUNTER — Emergency Department (HOSPITAL_COMMUNITY): Payer: Medicare Other

## 2021-05-26 ENCOUNTER — Emergency Department (HOSPITAL_COMMUNITY)
Admission: EM | Admit: 2021-05-26 | Discharge: 2021-05-27 | Disposition: A | Discharge status: Home / Self Care | Payer: Medicare Other | Source: Home / Self Care

## 2021-05-26 ENCOUNTER — Encounter (HOSPITAL_COMMUNITY): Payer: Self-pay

## 2021-05-26 ENCOUNTER — Ambulatory Visit (HOSPITAL_BASED_OUTPATIENT_CLINIC_OR_DEPARTMENT_OTHER)
Admission: RE | Admit: 2021-05-26 | Discharge: 2021-05-26 | Disposition: A | Discharge status: Home / Self Care | Payer: Medicare Other | Source: Ambulatory Visit | Attending: Emergency Medicine | Admitting: Emergency Medicine

## 2021-05-26 DIAGNOSIS — R0602 Shortness of breath: Secondary | ICD-10-CM | POA: Insufficient documentation

## 2021-05-26 DIAGNOSIS — R112 Nausea with vomiting, unspecified: Secondary | ICD-10-CM | POA: Insufficient documentation

## 2021-05-26 DIAGNOSIS — M25561 Pain in right knee: Secondary | ICD-10-CM | POA: Diagnosis not present

## 2021-05-26 DIAGNOSIS — R9389 Abnormal findings on diagnostic imaging of other specified body structures: Secondary | ICD-10-CM | POA: Insufficient documentation

## 2021-05-26 DIAGNOSIS — M79604 Pain in right leg: Secondary | ICD-10-CM | POA: Insufficient documentation

## 2021-05-26 DIAGNOSIS — R079 Chest pain, unspecified: Secondary | ICD-10-CM

## 2021-05-26 DIAGNOSIS — M79661 Pain in right lower leg: Secondary | ICD-10-CM | POA: Insufficient documentation

## 2021-05-26 DIAGNOSIS — Z5321 Procedure and treatment not carried out due to patient leaving prior to being seen by health care provider: Secondary | ICD-10-CM | POA: Insufficient documentation

## 2021-05-26 LAB — COMPREHENSIVE METABOLIC PANEL
ALT: 24 U/L (ref 0–44)
AST: 29 U/L (ref 15–41)
Albumin: 4.6 g/dL (ref 3.5–5.0)
Alkaline Phosphatase: 53 U/L (ref 38–126)
Anion gap: 9 (ref 5–15)
BUN: 20 mg/dL (ref 6–20)
CO2: 26 mmol/L (ref 22–32)
Calcium: 9.3 mg/dL (ref 8.9–10.3)
Chloride: 103 mmol/L (ref 98–111)
Creatinine, Ser: 1.09 mg/dL (ref 0.61–1.24)
GFR, Estimated: >60 mL/min (ref >60)
Glucose, Bld: 104 mg/dL — ABNORMAL HIGH (ref 70–99)
Potassium: 3.9 mmol/L (ref 3.5–5.1)
Sodium: 138 mmol/L (ref 135–145)
Total Bilirubin: 0.5 mg/dL (ref 0.3–1.2)
Total Protein: 7.6 g/dL (ref 6.5–8.1)

## 2021-05-26 LAB — DIFFERENTIAL
Abs Immature Granulocytes: 0.05 10*3/uL (ref 0.00–0.07)
Basophils Absolute: 0 10*3/uL (ref 0.0–0.1)
Basophils Relative: 0 %
Eosinophils Absolute: 0.4 10*3/uL (ref 0.0–0.5)
Eosinophils Relative: 3 %
Immature Granulocytes: 0 %
Lymphocytes Relative: 24 %
Lymphs Abs: 3 10*3/uL (ref 0.7–4.0)
Monocytes Absolute: 0.9 10*3/uL (ref 0.1–1.0)
Monocytes Relative: 7 %
Neutro Abs: 8.1 10*3/uL — ABNORMAL HIGH (ref 1.7–7.7)
Neutrophils Relative %: 66 %

## 2021-05-26 LAB — CBC
HCT: 45.8 % (ref 39.0–52.0)
Hemoglobin: 15.8 g/dL (ref 13.0–17.0)
MCH: 30.6 pg (ref 26.0–34.0)
MCHC: 34.5 g/dL (ref 30.0–36.0)
MCV: 88.8 fL (ref 80.0–100.0)
Platelets: 115 10*3/uL — ABNORMAL LOW (ref 150–400)
RBC: 5.16 MIL/uL (ref 4.22–5.81)
RDW: 13.2 % (ref 11.5–15.5)
WBC: 12.5 10*3/uL — ABNORMAL HIGH (ref 4.0–10.5)
nRBC: 0 % (ref 0.0–0.2)

## 2021-05-26 LAB — CBC WITH DIFFERENTIAL/PLATELET
Abs Immature Granulocytes: 0.03 10*3/uL (ref 0.00–0.07)
Basophils Absolute: 0.1 10*3/uL (ref 0.0–0.1)
Basophils Relative: 0 %
Eosinophils Absolute: 0.4 10*3/uL (ref 0.0–0.5)
Eosinophils Relative: 3 %
HCT: 44.4 % (ref 39.0–52.0)
Hemoglobin: 15.3 g/dL (ref 13.0–17.0)
Immature Granulocytes: 0 %
Lymphocytes Relative: 19 %
Lymphs Abs: 2.5 10*3/uL (ref 0.7–4.0)
MCH: 30.8 pg (ref 26.0–34.0)
MCHC: 34.5 g/dL (ref 30.0–36.0)
MCV: 89.3 fL (ref 80.0–100.0)
Monocytes Absolute: 0.6 10*3/uL (ref 0.1–1.0)
Monocytes Relative: 5 %
Neutro Abs: 9.4 10*3/uL — ABNORMAL HIGH (ref 1.7–7.7)
Neutrophils Relative %: 73 %
Platelets: 121 10*3/uL — ABNORMAL LOW (ref 150–400)
RBC: 4.97 MIL/uL (ref 4.22–5.81)
RDW: 13.4 % (ref 11.5–15.5)
WBC: 13 10*3/uL — ABNORMAL HIGH (ref 4.0–10.5)
nRBC: 0 % (ref 0.0–0.2)

## 2021-05-26 LAB — BASIC METABOLIC PANEL
Anion gap: 8 (ref 5–15)
BUN: 15 mg/dL (ref 6–20)
CO2: 26 mmol/L (ref 22–32)
Calcium: 9.5 mg/dL (ref 8.9–10.3)
Chloride: 104 mmol/L (ref 98–111)
Creatinine, Ser: 0.94 mg/dL (ref 0.61–1.24)
GFR, Estimated: >60 mL/min (ref >60)
Glucose, Bld: 89 mg/dL (ref 70–99)
Potassium: 4.1 mmol/L (ref 3.5–5.1)
Sodium: 138 mmol/L (ref 135–145)

## 2021-05-26 LAB — TROPONIN I (HIGH SENSITIVITY)
Troponin I (High Sensitivity): 4 ng/L (ref <18)
Troponin I (High Sensitivity): 4 ng/L (ref <18)

## 2021-05-26 LAB — RESP PANEL BY RT-PCR (FLU A&B, COVID) ARPGX2
Influenza A by PCR: NEGATIVE
Influenza B by PCR: NEGATIVE
SARS Coronavirus 2 by RT PCR: NEGATIVE

## 2021-05-26 MED ORDER — APIXABAN 5 MG PO TABS: 5.0000 mg | ORAL_TABLET | Freq: Two times a day (BID) | ORAL | Status: DC

## 2021-05-26 MED ORDER — HYDROMORPHONE HCL 1 MG/ML IJ SOLN
1.0000 mg | Freq: Once | INTRAMUSCULAR | Status: AC
  Administered 2021-05-26: 1 mg via INTRAVENOUS
  Filled 2021-05-26: qty 1

## 2021-05-26 MED ORDER — DOXYCYCLINE HYCLATE 100 MG PO TABS
100.0000 mg | ORAL_TABLET | Freq: Once | ORAL | Status: AC
  Administered 2021-05-26: 100 mg via ORAL
  Filled 2021-05-26: qty 1

## 2021-05-26 MED ORDER — OXYCODONE-ACETAMINOPHEN 5-325 MG PO TABS
1.0000 | ORAL_TABLET | Freq: Once | ORAL | Status: AC
  Administered 2021-05-26: 1 via ORAL
  Filled 2021-05-26: qty 1

## 2021-05-26 MED ORDER — DOXYCYCLINE HYCLATE 100 MG PO CAPS: 100.0000 mg | ORAL_CAPSULE | Freq: Two times a day (BID) | ORAL | 0 refills | Status: AC

## 2021-05-26 MED ORDER — FENTANYL CITRATE (PF) 100 MCG/2ML IJ SOLN
50.0000 ug | Freq: Once | INTRAMUSCULAR | Status: AC
  Administered 2021-05-26: 50 ug via INTRAVENOUS
  Filled 2021-05-26: qty 2

## 2021-05-26 MED ORDER — IOHEXOL 350 MG/ML SOLN
80.0000 mL | Freq: Once | INTRAVENOUS | Status: AC | PRN
  Administered 2021-05-26: 80 mL via INTRAVENOUS

## 2021-05-26 MED ORDER — APIXABAN 5 MG PO TABS
10.0000 mg | ORAL_TABLET | Freq: Two times a day (BID) | ORAL | Status: DC
  Administered 2021-05-26: 10 mg via ORAL
  Filled 2021-05-26: qty 2

## 2021-05-26 MED ORDER — KETAMINE HCL 50 MG/5ML IJ SOSY
0.3000 mg/kg | PREFILLED_SYRINGE | Freq: Once | INTRAMUSCULAR | Status: AC
  Administered 2021-05-26: 23 mg via INTRAVENOUS
  Filled 2021-05-26: qty 5

## 2021-05-26 NOTE — ED Triage Notes (Signed)
Pt. Stated, Ive had rt. Leg pain with some SOB for a week. Had a doppler study today it was positive

## 2021-05-26 NOTE — ED Provider Notes (Signed)
[place] DEPT Provider Note   CSN: [id number] Arrival date & time: 05/25/21  2335     History Chief Complaint  Patient presents with   Leg Pain   Fever    Alex Jacobs is a 55 y.o. male.  The history is provided by the patient and medical records.  Leg Pain Alex Jacobs is a 55 y.o. male who presents to the Emergency Department complaining of knee pain.  He presents to the ED for evaluation of knee pain that started this morning.  No report of trauma.  Sxs have progressively worsened throughout the day. He reports of fever to 102.1 today and feels hot. He does have some nausea. He does have a history of sepsis in the past following spinal fusion.   Tobacco. Remote hx/o IVDA (years ago).  Hx/o hepatitis.  No alcohol. No history of osteomyelitis or discredits. No history of septic arthritis.    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[facility])    Hepatitis C     Patient Active Problem List   Diagnosis Date Noted   MDD (major depressive disorder), recurrent episode, moderate ([place]) 05/30/2019   PTSD (post-traumatic stress disorder) 05/30/2019   Insomnia 05/30/2019   Suicidal ideation 05/28/2019   Alcohol withdrawal ([place]) 05/27/2019    Past Surgical History:  Procedure Laterality Date   APPENDECTOMY     per pt 2018   BACK SURGERY         Family History  Problem Relation Age of Onset   Cancer Mother    Cancer Father     Social History   Tobacco Use   Smoking status: Every Day    Packs/day: 0.50    Types: Cigarettes   Smokeless tobacco: Never  Vaping Use   Vaping Use: Never used  Substance Use Topics   Alcohol use: Yes    Comment: daily use  a fifth a day   Drug use: Yes    Types: Methamphetamines, IV    Comment: heroin, fentanyl    Home Medications Prior to Admission medications   Medication Sig Start Date End Date Taking? Authorizing Provider  calcium carbonate (TUMS - DOSED IN MG ELEMENTAL CALCIUM) 500 MG  chewable tablet Chew 2 tablets by mouth daily as needed for indigestion or heartburn.   Yes [provider]  doxycycline (VIBRAMYCIN) 100 MG capsule Take 1 capsule (100 mg total) by mouth 2 (two) times daily. 05/26/21  Yes Quintella Reichert, MD  ibuprofen (ADVIL) 200 MG tablet Take 200 mg by mouth daily as needed (for pain).   Yes [provider]  chlordiazePOXIDE (LIBRIUM) 25 MG capsule 50mg  PO TID x 1D, then 25-50mg  PO BID X 1D, then 25-50mg  PO QD X 1D Patient not taking: Reported on 05/26/2021 05/18/21   Veryl Speak, MD    Allergies    Toradol [ketorolac tromethamine]  Review of Systems   Review of Systems  All other systems reviewed and are negative.  Physical Exam Updated Vital Signs BP 132/72   Pulse 88   Temp 98.1 F (36.7 C) (Oral)   Resp 12   Ht 5\' 8"  (1.727 m)   Wt 77.1 kg   SpO2 97%   BMI 25.85 kg/m   Physical Exam Vitals and nursing note reviewed.  Constitutional:      Appearance: He is well-developed.  HENT:     Head: Normocephalic and atraumatic.  Cardiovascular:     Rate and Rhythm: Normal rate and regular rhythm.  Heart sounds: No murmur heard. Pulmonary:     Effort: Pulmonary effort is normal. No respiratory distress.     Breath sounds: Normal breath sounds.  Abdominal:     Palpations: Abdomen is soft.     Tenderness: There is no abdominal tenderness. There is no guarding or rebound.  Musculoskeletal:     Comments: 2+ DP pulses bilaterally. Flexion extension is intact at the ankle. He has significant tenderness to palpation over the right popliteal fossa. There is no tenderness to palpation over the right anterior knee. There is no bony tenderness to palpation throughout the femur tibia. He is able to flex and extend the knee but does have pain with range of motion. There is no need effusion. There is no erythema or skin changes.  Skin:    General: Skin is warm and dry.  Neurological:     Mental Status: He is alert and oriented to  person, place, and time.  Psychiatric:        Behavior: Behavior normal.    ED Results / Procedures / Treatments   Labs (all labs ordered are listed, but only abnormal results are displayed) Labs Reviewed  COMPREHENSIVE METABOLIC PANEL - Abnormal; Notable for the following components:      Result Value   Glucose, Bld 104 (*)    All other components within normal limits  CBC WITH DIFFERENTIAL/PLATELET - Abnormal; Notable for the following components:   WBC 13.0 (*)    Platelets 121 (*)    Neutro Abs 9.4 (*)    All other components within normal limits  RESP PANEL BY RT-PCR (FLU A&B, COVID) [id number]    EKG None  Radiology DG Chest 2 View  Result Date: 05/26/2021 CLINICAL DATA:  Fevers EXAM: CHEST - 2 VIEW COMPARISON:  09/02/2019 FINDINGS: The heart size and mediastinal contours are within normal limits. Both lungs are clear. The visualized skeletal structures are unremarkable. IMPRESSION: No active cardiopulmonary disease. Electronically Signed   By: Inez Catalina M.D.   On: 05/26/2021 01:49   DG Knee Complete 4 Views Right  Result Date: 05/26/2021 CLINICAL DATA:  Right knee pain, no known injury, initial encounter EXAM: RIGHT KNEE - COMPLETE 4+ VIEW COMPARISON:  None. FINDINGS: No evidence of fracture, dislocation, or joint effusion. No evidence of arthropathy or other focal bone abnormality. Soft tissues are unremarkable. IMPRESSION: No acute abnormality noted. Electronically Signed   By: Inez Catalina M.D.   On: 05/26/2021 01:51   CT EXTREMITY LOWER RIGHT W CONTRAST  Result Date: 05/26/2021 CLINICAL DATA:  Severe posterior right knee pain and fevers, no known injury, initial encounter EXAM: CT OF THE LOWER RIGHT EXTREMITY WITH CONTRAST TECHNIQUE: Multidetector CT imaging of the lower right extremity was performed according to the standard protocol following intravenous contrast administration. CONTRAST:  30mL OMNIPAQUE IOHEXOL 350 MG/ML SOLN COMPARISON:  None. FINDINGS:  Bones/Joint/Cartilage No acute bony abnormality is noted. Ligaments Suboptimally assessed by CT. Muscles and Tendons Musculature of the right lower extremity appears within normal limits. Soft tissues Soft tissues show no focal abscess or subcutaneous edema. Visualized arterial and venous structures are within normal limits with the exception of mild atherosclerotic changes in the distal superficial femoral artery. Changes of prior appendectomy are noted. No pelvic abnormality is seen. IMPRESSION: No acute abnormality is noted to correspond with the patient's given clinical symptomatology. Mild atherosclerotic changes are noted in the distal superficial femoral artery. Electronically Signed   By: Inez Catalina M.D.   On: 05/26/2021 03:44  Procedures Procedures   Medications Ordered in ED Medications  doxycycline (VIBRA-TABS) tablet 100 mg (has no administration in time range)  ketamine 50 mg in normal saline 5 mL (10 mg/mL) syringe (23 mg Intravenous Given 05/26/21 0116)  HYDROmorphone (DILAUDID) injection 1 mg (1 mg Intravenous Given 05/26/21 0250)  iohexol (OMNIPAQUE) 350 MG/ML injection 80 mL (80 mLs Intravenous Contrast Given 05/26/21 0317)    ED Course  I have reviewed the triage vital signs and the nursing notes.  Pertinent labs & imaging results that were available during my care of the patient were reviewed by me and considered in my medical decision making (see chart for details).    MDM Rules/Calculators/A&P                          patient here for evaluation of severe pain to the right posterior knee, atraumatic in nature. He has local tenderness to palpation but no overlying skin changes. No clinical evidence of septic arthritis. No clinical evidence of limb ischemia. Imaging is negative for acute abnormality. CBC with mild leukocytosis. Will treat for possible early cellulitis without clear skin changes given patient's pain. Will also refer to have vascular ultrasound obtained  later today to rule out DVT. Discussed with patient unclear source of his current symptoms but importance of outpatient follow-up and return precautions.  Final Clinical Impression(s) / ED Diagnoses Final diagnoses:  Right leg pain    Rx / DC Orders ED Discharge Orders          Ordered    doxycycline (VIBRAMYCIN) 100 MG capsule  2 times daily        05/26/21 0412    VAS [place] LOWER EXTREMITY VENOUS (DVT)        05/26/21 0416             Quintella Reichert, MD 05/26/21 [phone]

## 2021-05-26 NOTE — ED Notes (Signed)
Pt reports increased pain in his legs. Triage RN made aware.

## 2021-05-26 NOTE — ED Provider Notes (Signed)
Emergency Medicine Provider Triage Evaluation Note  Alex Jacobs , a 55 y.o. male  was evaluated in triage.  Pt complains of Nausea, vomiting chest pain or shortness of breath.  Patient was seen yesterday and evaluated for right leg pain and swelling.  He has a DVT.  Patient states he did not tell the provider yesterday that he had been having chest pain shortness of breath the last 2 weeks.  States it worsened today with exertion..  Review of Systems  Positive: Right leg swelling, chest pain, shortness of breath Negative: Nausea, vomiting  Physical Exam  There were no vitals taken for this visit. Gen:   Awake, no distress   Resp:  Normal effort  MSK:   Moves extremities without difficulty/right leg tenderness and swelling Other:  S1-S2  Medical Decision Making  Medically screening exam initiated at 2:59 PM.  Appropriate orders placed.  Alex Jacobs was informed that the remainder of the evaluation will be completed by another provider, this initial triage assessment does not replace that evaluation, and the importance of remaining in the ED until their evaluation is complete.  Patient reporting chest pain, states it worsened today and also shortness of breath.  He has been having the symptoms for a few weeks, but he did not have a troponin yesterday.  We will initiate chest pain work-up for ACS, PE on differential.  He is not hypoxic or tachycardic, seems less likely.   Sherrill Raring, PA-C 05/26/21 [place]    Davonna Belling, MD 05/26/21 2115

## 2021-05-26 NOTE — Discharge Instructions (Addendum)
The cause of your symptoms was not identified today. We are starting antibiotics in case you're developing an infection in your skin called cellulitis. Get rechecked if you develop worsening pain, new concerning symptoms or difficulty breathing. Please follow-up to get your ultrasound later today to make sure you don't have a blood clot in your leg.

## 2021-05-26 NOTE — Progress Notes (Addendum)
VASCULAR LAB    Right lower extremity venous duplex has been performed.  See CV proc for preliminary results.  Gave verbal report to triage  Kaushal Vannice, RVT 05/26/2021, 2:03 PM

## 2021-05-27 ENCOUNTER — Emergency Department (HOSPITAL_BASED_OUTPATIENT_CLINIC_OR_DEPARTMENT_OTHER)
Admission: EM | Admit: 2021-05-27 | Discharge: 2021-05-27 | Disposition: A | Discharge status: Home / Self Care | Payer: Medicare Other | Attending: Emergency Medicine | Admitting: Emergency Medicine

## 2021-05-27 ENCOUNTER — Encounter (HOSPITAL_BASED_OUTPATIENT_CLINIC_OR_DEPARTMENT_OTHER): Payer: Self-pay | Admitting: Emergency Medicine

## 2021-05-27 ENCOUNTER — Other Ambulatory Visit: Payer: Self-pay

## 2021-05-27 ENCOUNTER — Telehealth: Payer: Self-pay | Admitting: *Deleted

## 2021-05-27 DIAGNOSIS — F17210 Nicotine dependence, cigarettes, uncomplicated: Secondary | ICD-10-CM | POA: Insufficient documentation

## 2021-05-27 DIAGNOSIS — I82461 Acute embolism and thrombosis of right calf muscular vein: Secondary | ICD-10-CM

## 2021-05-27 DIAGNOSIS — R0602 Shortness of breath: Secondary | ICD-10-CM | POA: Diagnosis not present

## 2021-05-27 DIAGNOSIS — R509 Fever, unspecified: Secondary | ICD-10-CM | POA: Insufficient documentation

## 2021-05-27 DIAGNOSIS — M79604 Pain in right leg: Secondary | ICD-10-CM | POA: Diagnosis not present

## 2021-05-27 LAB — CBC WITH DIFFERENTIAL/PLATELET
Abs Immature Granulocytes: 0.04 10*3/uL (ref 0.00–0.07)
Basophils Absolute: 0.1 10*3/uL (ref 0.0–0.1)
Basophils Relative: 1 %
Eosinophils Absolute: 0.2 10*3/uL (ref 0.0–0.5)
Eosinophils Relative: 1 %
HCT: 49.9 % (ref 39.0–52.0)
Hemoglobin: 17.3 g/dL — ABNORMAL HIGH (ref 13.0–17.0)
Immature Granulocytes: 0 %
Lymphocytes Relative: 20 %
Lymphs Abs: 2.1 10*3/uL (ref 0.7–4.0)
MCH: 30.3 pg (ref 26.0–34.0)
MCHC: 34.7 g/dL (ref 30.0–36.0)
MCV: 87.4 fL (ref 80.0–100.0)
Monocytes Absolute: 0.7 10*3/uL (ref 0.1–1.0)
Monocytes Relative: 7 %
Neutro Abs: 7.4 10*3/uL (ref 1.7–7.7)
Neutrophils Relative %: 71 %
Platelets: 133 10*3/uL — ABNORMAL LOW (ref 150–400)
RBC: 5.71 MIL/uL (ref 4.22–5.81)
RDW: 13.3 % (ref 11.5–15.5)
WBC: 10.4 10*3/uL (ref 4.0–10.5)
nRBC: 0 % (ref 0.0–0.2)

## 2021-05-27 LAB — BASIC METABOLIC PANEL
Anion gap: 12 (ref 5–15)
BUN: 15 mg/dL (ref 6–20)
CO2: 24 mmol/L (ref 22–32)
Calcium: 10.5 mg/dL — ABNORMAL HIGH (ref 8.9–10.3)
Chloride: 103 mmol/L (ref 98–111)
Creatinine, Ser: 0.8 mg/dL (ref 0.61–1.24)
GFR, Estimated: >60 mL/min (ref >60)
Glucose, Bld: 113 mg/dL — ABNORMAL HIGH (ref 70–99)
Potassium: 4.3 mmol/L (ref 3.5–5.1)
Sodium: 139 mmol/L (ref 135–145)

## 2021-05-27 LAB — TROPONIN I (HIGH SENSITIVITY): Troponin I (High Sensitivity): <2 ng/L (ref <18)

## 2021-05-27 MED ORDER — RIVAROXABAN (XARELTO) VTE STARTER PACK (15 & 20 MG): ORAL_TABLET | ORAL | 0 refills | Status: AC

## 2021-05-27 NOTE — ED Provider Notes (Signed)
[place] EMERGENCY DEPT Provider Note   CSN: [id number] Arrival date & time: 05/27/21  1117     History Chief Complaint  Patient presents with   Shortness of Breath    Alex Jacobs is a 55 y.o. male.  Patient presents due to leg pain, fever, and shortness of breath.  He presented to [facility] emergency room on October 15 due to previously stated symptoms and was sent out with outpatient lower extremity ultrasound.  The study showed that he did have a DVT to his right popliteal vein.  Patient presented to [facility] following test results and CT PE was completed that was negative.  He subsequently left before being prescribed anticoagulation and returned to [facility] the next day.  Patient reports that his symptoms are about the same as yesterday.  He has not noticed any worsening of his pain in his right leg or worsening of his shortness of breath.  On lab evaluation from recent ED visits, patient had a mild leukocytosis.    Shortness of Breath     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([place])    Hepatitis C     Patient Active Problem List   Diagnosis Date Noted   MDD (major depressive disorder), recurrent episode, moderate ([place]) 05/30/2019   PTSD (post-traumatic stress disorder) 05/30/2019   Insomnia 05/30/2019   Suicidal ideation 05/28/2019   Alcohol withdrawal ([place]) 05/27/2019    Past Surgical History:  Procedure Laterality Date   APPENDECTOMY     per pt 2018   BACK SURGERY         Family History  Problem Relation Age of Onset   Cancer Mother    Cancer Father     Social History   Tobacco Use   Smoking status: Every Day    Packs/day: 0.50    Types: Cigarettes   Smokeless tobacco: Never  Vaping Use   Vaping Use: Never used  Substance Use Topics   Alcohol use: Yes    Comment: daily use  a fifth a day   Drug use: Not Currently    Types: Methamphetamines, IV    Comment: heroin, fentanyl    Home  Medications Prior to Admission medications   Medication Sig Start Date End Date Taking? Authorizing Provider  calcium carbonate (TUMS - DOSED IN MG ELEMENTAL CALCIUM) 500 MG chewable tablet Chew 2 tablets by mouth daily as needed for indigestion or heartburn.    [provider]  chlordiazePOXIDE (LIBRIUM) 25 MG capsule 50mg  PO TID x 1D, then 25-50mg  PO BID X 1D, then 25-50mg  PO QD X 1D Patient not taking: Reported on 05/26/2021 05/18/21   Veryl Speak, MD  doxycycline (VIBRAMYCIN) 100 MG capsule Take 1 capsule (100 mg total) by mouth 2 (two) times daily. 05/26/21   Quintella Reichert, MD  ibuprofen (ADVIL) 200 MG tablet Take 200 mg by mouth daily as needed (for pain).    [provider]    Allergies    Ketorolac and Toradol [ketorolac tromethamine]  Review of Systems   Review of Systems  Constitutional: Negative.   Eyes: Negative.   Respiratory:  Positive for shortness of breath.   Cardiovascular:  Positive for leg swelling.  Gastrointestinal: Negative.   Endocrine: Negative.   Neurological: Negative.    Physical Exam Updated Vital Signs BP (!) 132/101   Pulse 82   Temp 98.3 F (36.8 C)   Resp 20   Ht 5\' 9"  (1.753 m)   Wt 77.1  kg   SpO2 94%   BMI 25.10 kg/m   Physical Exam Constitutional:      General: He is not in acute distress.    Appearance: He is not ill-appearing or diaphoretic.  Cardiovascular:     Rate and Rhythm: Normal rate and regular rhythm.  Pulmonary:     Effort: No tachypnea.  Musculoskeletal:     Right lower leg: Tenderness present. Edema present.     Comments: Tenderness to palpation in right popliteal fossa with mild edema, no erythema  Skin:    General: Skin is warm and dry.  Neurological:     General: No focal deficit present.     Mental Status: He is alert.    ED Results / Procedures / Treatments   Labs (all labs ordered are listed, but only abnormal results are displayed) Labs Reviewed  CBC WITH DIFFERENTIAL/PLATELET -  Abnormal; Notable for the following components:      Result Value   Hemoglobin 17.3 (*)    Platelets 133 (*)    All other components within normal limits  BASIC METABOLIC PANEL - Abnormal; Notable for the following components:   Glucose, Bld 113 (*)    Calcium 10.5 (*)    All other components within normal limits  TROPONIN I (HIGH SENSITIVITY)  TROPONIN I (HIGH SENSITIVITY)    EKG None  Radiology DG Chest 2 View  Result Date: 05/26/2021 CLINICAL DATA:  Chest pain EXAM: CHEST - 2 VIEW COMPARISON:  Same day chest radiograph at 0135 hours FINDINGS: The heart size and mediastinal contours are within normal limits. No focal consolidation. No pleural effusion. No pneumothorax. Partially visualized cervical fusion hardware. IMPRESSION: No active cardiopulmonary disease. Electronically Signed   By: Dahlia Bailiff M.D.   On: 05/26/2021 15:29   DG Chest 2 View  Result Date: 05/26/2021 CLINICAL DATA:  Fevers EXAM: CHEST - 2 VIEW COMPARISON:  09/02/2019 FINDINGS: The heart size and mediastinal contours are within normal limits. Both lungs are clear. The visualized skeletal structures are unremarkable. IMPRESSION: No active cardiopulmonary disease. Electronically Signed   By: Inez Catalina M.D.   On: 05/26/2021 01:49   CT Angio Chest PE W/Cm &/Or Wo Cm  Result Date: 05/26/2021 CLINICAL DATA:  Shortness of breath and leg pain,, known positive DVT study, initial encounter EXAM: CT ANGIOGRAPHY CHEST WITH CONTRAST TECHNIQUE: Multidetector CT imaging of the chest was performed using the standard protocol during bolus administration of intravenous contrast. Multiplanar CT image reconstructions and MIPs were obtained to evaluate the vascular anatomy. CONTRAST:  79mL OMNIPAQUE IOHEXOL 350 MG/ML SOLN COMPARISON:  Chest x-ray from earlier in the same day. FINDINGS: Cardiovascular: Thoracic aorta and its branches are well visualized without aneurysmal dilatation or dissection. Minimal atherosclerotic  calcifications are seen. Pulmonary artery shows a normal branching pattern. No filling defect to suggest pulmonary embolism is noted. No cardiac enlargement is seen. No significant coronary calcifications are noted. Mediastinum/Nodes: Thoracic inlet is within normal limits. No sizable hilar or mediastinal adenopathy is noted. The esophagus as visualized is within normal limits. Lungs/Pleura: Lungs are well aerated bilaterally without focal confluent infiltrate. Some patchy ground-glass opacities are noted within the upper lobes bilaterally. No sizable effusion is seen. Upper Abdomen: Visualized upper abdomen is unremarkable. Musculoskeletal: Postoperative changes in the cervical spine are seen. Degenerative change of the thoracic spine is noted. No acute rib abnormality is seen. Review of the MIP images confirms the above findings. IMPRESSION: No evidence of pulmonary emboli. No aortic abnormality is noted. Patchy ground-glass  opacities of uncertain significance. This may represent some mild postinflammatory change. Aortic Atherosclerosis (ICD10-I70.0). Electronically Signed   By: Inez Catalina M.D.   On: 05/26/2021 22:55   DG Knee Complete 4 Views Right  Result Date: 05/26/2021 CLINICAL DATA:  Right knee pain, no known injury, initial encounter EXAM: RIGHT KNEE - COMPLETE 4+ VIEW COMPARISON:  None. FINDINGS: No evidence of fracture, dislocation, or joint effusion. No evidence of arthropathy or other focal bone abnormality. Soft tissues are unremarkable. IMPRESSION: No acute abnormality noted. Electronically Signed   By: Inez Catalina M.D.   On: 05/26/2021 01:51   CT EXTREMITY LOWER RIGHT W CONTRAST  Result Date: 05/26/2021 CLINICAL DATA:  Severe posterior right knee pain and fevers, no known injury, initial encounter EXAM: CT OF THE LOWER RIGHT EXTREMITY WITH CONTRAST TECHNIQUE: Multidetector CT imaging of the lower right extremity was performed according to the standard protocol following intravenous  contrast administration. CONTRAST:  35mL OMNIPAQUE IOHEXOL 350 MG/ML SOLN COMPARISON:  None. FINDINGS: Bones/Joint/Cartilage No acute bony abnormality is noted. Ligaments Suboptimally assessed by CT. Muscles and Tendons Musculature of the right lower extremity appears within normal limits. Soft tissues Soft tissues show no focal abscess or subcutaneous edema. Visualized arterial and venous structures are within normal limits with the exception of mild atherosclerotic changes in the distal superficial femoral artery. Changes of prior appendectomy are noted. No pelvic abnormality is seen. IMPRESSION: No acute abnormality is noted to correspond with the patient's given clinical symptomatology. Mild atherosclerotic changes are noted in the distal superficial femoral artery. Electronically Signed   By: Inez Catalina M.D.   On: 05/26/2021 03:44   VAS [place] LOWER EXTREMITY VENOUS (DVT)  Result Date: 05/26/2021  Lower Venous DVT Study Patient Name:  ARSHDEEP BOLGER  Date of Exam:   05/26/2021 Medical Rec #: [id number]      Accession #:    [id number] Date of Birth: 31-Mar-1966      Patient Gender: M Patient Age:   17 years Exam Location:  [facility] Procedure:      VAS [place] LOWER EXTREMITY VENOUS (DVT) Referring Phys: Benjamine Mola REES --------------------------------------------------------------------------------  Indications: Lateral and posterior knee pain. Patein endorsing new chest pain and shortness of breath.  Comparison Study: No prior study on file Performing Technologist: Sharion Dove RVS  Examination Guidelines: A complete evaluation includes B-mode imaging, spectral Doppler, color Doppler, and power Doppler as needed of all accessible portions of each vessel. Bilateral testing is considered an integral part of a complete examination. Limited examinations for reoccurring indications may be performed as noted. The reflux portion of the exam is performed with the patient in reverse Trendelenburg.   +---------+---------------+---------+-----------+----------+--------------+ RIGHT    CompressibilityPhasicitySpontaneityPropertiesThrombus Aging +---------+---------------+---------+-----------+----------+--------------+ CFV      Full           Yes      Yes                                 +---------+---------------+---------+-----------+----------+--------------+ SFJ      Full                                                        +---------+---------------+---------+-----------+----------+--------------+ FV Prox  Full                                                        +---------+---------------+---------+-----------+----------+--------------+  FV Mid   Full                                                        +---------+---------------+---------+-----------+----------+--------------+ FV DistalFull                                                        +---------+---------------+---------+-----------+----------+--------------+ PFV      Full                                                        +---------+---------------+---------+-----------+----------+--------------+ POP      Full           Yes      Yes                                 +---------+---------------+---------+-----------+----------+--------------+ PTV      Full                                                        +---------+---------------+---------+-----------+----------+--------------+ PERO     Full                                                        +---------+---------------+---------+-----------+----------+--------------+ Soleal   Partial        No       No                   Acute          +---------+---------------+---------+-----------+----------+--------------+ Gastroc  Full                                                        +---------+---------------+---------+-----------+----------+--------------+ Dilated Soleal vein with static flow that does  not dissipate with distal compression.  +----+---------------+---------+-----------+----------+--------------+ LEFTCompressibilityPhasicitySpontaneityPropertiesThrombus Aging +----+---------------+---------+-----------+----------+--------------+ CFV Full           Yes      Yes                                 +----+---------------+---------+-----------+----------+--------------+     Summary: RIGHT: - Findings consistent with acute deep vein thrombosis involving the right soleal veins.  LEFT: - No evidence of common femoral vein obstruction.  *See table(s) above for measurements and observations. Electronically signed by Monica Martinez MD on 05/26/2021 at 3:43:34 PM.    Final     Procedures Procedures  Medications Ordered in ED Medications - No data to display  ED Course  I have reviewed the triage vital signs and the nursing notes.  Pertinent labs & imaging results that were available during my care of the patient were reviewed by me and considered in my medical decision making (see chart for details).    MDM Rules/Calculators/A&P                           Patient is a 55 year old who presented with diagnosis of unprovoked DVT of right soleal vein with CT PE negative. He denies recent travel history or injury.  Family history of his grandfather passing away in his sleep due to pulmonary embolism. Started patient on started pack of Xarelto and discussed that he will need to take this for three months.  Educated him on importance of taking Xarelto and following up with his Primary care physician within a week.     Final Clinical Impression(s) / ED Diagnoses Final diagnoses:  None    Rx / DC Orders ED Discharge Orders     None        Jhett Fretwell, Joellen [place], DO 05/27/21 1502    Lucrezia Starch, MD 05/27/21 2123

## 2021-05-27 NOTE — ED Notes (Signed)
Reviewed discharge instructions including S/S for need to return to ED.  To follow up with PCP in one week

## 2021-05-27 NOTE — Discharge Instructions (Addendum)
Alex Jacobs,  You have a clot in your right leg and will need to start taking a medication to help thin your blood to dissolve this.  This medication is called Xarelto.  Take one 15mg  tablet by mouth twice a day. On day 22, switch to one 20mg  tablet once a day. Take with food..  Please follow-up with your primary care provider in 1 week.  I have written Xarelto for 1 month, he will need to take this for at least 3 months.  Your primary care provider will help you with the decision if you can stop taking the Xarelto at 3 months.  This decision will be based on your other risk factors and your chances of having future blood clots.    Thank you for allowing [place] to take part in your care, Christiana Fuchs, DO

## 2021-05-27 NOTE — ED Notes (Signed)
Pt stated he was leaving AMA

## 2021-05-27 NOTE — ED Triage Notes (Addendum)
Pt was seen yesterday for SOB and told that  he had a clot in his right leg. Pt reports pain on the back side of his right knee that radiates up his thigh. Pt never made past the waiting room. Pt reports SOB with exertion. that started two days ago.  Pt adds that he also has chest pain. Irregular heart rate on ascultation.

## 2021-05-27 NOTE — Telephone Encounter (Signed)
Pt called regarding not being seen after hours of sitting in lobby.  Pt feels blood clot is traveling as the pain he felt is higher in his leg/thigh.  RNCM suggested he visit our [place] ED as it is closer to his home.  RNCM alerted EDP (Dykstra) of pt pending arrival.

## 2021-05-28 ENCOUNTER — Other Ambulatory Visit (HOSPITAL_BASED_OUTPATIENT_CLINIC_OR_DEPARTMENT_OTHER): Payer: Self-pay
